# Patient Record
Sex: Female | Born: 1945
Health system: Southern US, Community
[De-identification: ages and names within clinical notes are randomized; demographics above are authoritative.]

## PROBLEM LIST (undated history)

## (undated) DIAGNOSIS — K219 Gastro-esophageal reflux disease without esophagitis: Secondary | ICD-10-CM

## (undated) DIAGNOSIS — R233 Spontaneous ecchymoses: Secondary | ICD-10-CM

## (undated) DIAGNOSIS — H409 Unspecified glaucoma: Secondary | ICD-10-CM

## (undated) DIAGNOSIS — R918 Other nonspecific abnormal finding of lung field: Secondary | ICD-10-CM

## (undated) DIAGNOSIS — B019 Varicella without complication: Secondary | ICD-10-CM

## (undated) DIAGNOSIS — M2040 Other hammer toe(s) (acquired), unspecified foot: Secondary | ICD-10-CM

## (undated) DIAGNOSIS — R238 Other skin changes: Secondary | ICD-10-CM

## (undated) DIAGNOSIS — R609 Edema, unspecified: Secondary | ICD-10-CM

## (undated) DIAGNOSIS — Q796 Ehlers-Danlos syndrome, unspecified: Secondary | ICD-10-CM

## (undated) HISTORY — PX: COLONOSCOPY: SHX174

## (undated) HISTORY — DX: Edema, unspecified: R60.9

## (undated) HISTORY — DX: Other skin changes: R23.8

## (undated) HISTORY — DX: Unspecified glaucoma: H40.9

## (undated) HISTORY — PX: OTHER SURGICAL HISTORY: SHX169

## (undated) HISTORY — DX: Spontaneous ecchymoses: R23.3

## (undated) HISTORY — PX: HAMMER TOE SURGERY: SHX385

## (undated) HISTORY — DX: Ehlers-Danlos syndrome, unspecified: Q79.60

## (undated) HISTORY — DX: Other hammer toe(s) (acquired), unspecified foot: M20.40

## (undated) HISTORY — PX: BREAST BIOPSY: SHX20

---

## 2005-09-17 ENCOUNTER — Ambulatory Visit: Payer: Self-pay | Admitting: Unknown Physician Specialty

## 2005-12-29 ENCOUNTER — Emergency Department: Payer: Self-pay | Admitting: General Practice

## 2006-09-28 ENCOUNTER — Emergency Department: Payer: Self-pay | Admitting: Emergency Medicine

## 2007-05-17 DIAGNOSIS — N200 Calculus of kidney: Secondary | ICD-10-CM | POA: Insufficient documentation

## 2007-05-17 DIAGNOSIS — Q828 Other specified congenital malformations of skin: Secondary | ICD-10-CM | POA: Insufficient documentation

## 2007-05-19 ENCOUNTER — Ambulatory Visit: Payer: Self-pay | Admitting: Unknown Physician Specialty

## 2008-10-25 ENCOUNTER — Ambulatory Visit: Payer: Self-pay | Admitting: Unknown Physician Specialty

## 2008-11-28 ENCOUNTER — Ambulatory Visit: Payer: Self-pay | Admitting: Unknown Physician Specialty

## 2009-12-07 ENCOUNTER — Other Ambulatory Visit: Payer: Self-pay | Admitting: Family Medicine

## 2009-12-07 DIAGNOSIS — R0902 Hypoxemia: Secondary | ICD-10-CM | POA: Insufficient documentation

## 2009-12-07 LAB — HEPATIC FUNCTION PANEL
ALT: 28 U/L (ref 7–35)
AST: 24 U/L (ref 13–35)
Alkaline Phosphatase: 81 U/L (ref 25–125)
Bilirubin, Total: 0.4 mg/dL

## 2009-12-07 LAB — BASIC METABOLIC PANEL
BUN: 12 mg/dL (ref 4–21)
Creatinine: 0.8 mg/dL (ref 0.5–1.1)
Glucose: 90 mg/dL
Potassium: 3.9 mmol/L (ref 3.4–5.3)
Sodium: 138 mmol/L (ref 137–147)

## 2009-12-07 LAB — CBC AND DIFFERENTIAL
HCT: 43 % (ref 36–46)
Hemoglobin: 14.7 g/dL (ref 12.0–16.0)
Neutrophils Absolute: 4 /uL
Platelets: 333 10*3/uL (ref 150–399)
WBC: 6.6 10^3/mL

## 2009-12-14 ENCOUNTER — Ambulatory Visit: Payer: Self-pay | Admitting: General Surgery

## 2010-03-20 ENCOUNTER — Ambulatory Visit: Payer: Self-pay | Admitting: Internal Medicine

## 2010-06-24 ENCOUNTER — Ambulatory Visit: Payer: Self-pay | Admitting: Specialist

## 2010-11-07 ENCOUNTER — Ambulatory Visit: Payer: Self-pay | Admitting: Unknown Physician Specialty

## 2010-11-13 ENCOUNTER — Ambulatory Visit: Payer: Self-pay | Admitting: Unknown Physician Specialty

## 2010-12-24 ENCOUNTER — Ambulatory Visit: Payer: Self-pay | Admitting: Specialist

## 2011-06-19 ENCOUNTER — Ambulatory Visit: Payer: Self-pay | Admitting: Specialist

## 2011-12-02 ENCOUNTER — Ambulatory Visit: Payer: Self-pay | Admitting: Unknown Physician Specialty

## 2012-03-29 ENCOUNTER — Ambulatory Visit: Payer: Self-pay | Admitting: Specialist

## 2012-03-29 LAB — CREATININE, SERUM
Creatinine: 0.7 mg/dL (ref 0.60–1.30)
EGFR (African American): >60
EGFR (Non-African Amer.): >60

## 2012-09-01 ENCOUNTER — Ambulatory Visit: Payer: Self-pay | Admitting: Specialist

## 2012-12-29 HISTORY — PX: FOOT SURGERY: SHX648

## 2013-03-07 ENCOUNTER — Ambulatory Visit: Payer: Self-pay | Admitting: Specialist

## 2013-04-19 ENCOUNTER — Ambulatory Visit: Payer: Self-pay | Admitting: Family Medicine

## 2013-12-28 ENCOUNTER — Encounter: Payer: Self-pay | Admitting: Podiatry

## 2014-01-02 ENCOUNTER — Ambulatory Visit (INDEPENDENT_AMBULATORY_CARE_PROVIDER_SITE_OTHER): Payer: BC Managed Care – PPO | Admitting: Podiatry

## 2014-01-02 ENCOUNTER — Encounter: Payer: Self-pay | Admitting: Podiatry

## 2014-01-02 VITALS — BP 127/92 | HR 93 | Resp 16 | Ht 62.0 in | Wt 196.0 lb

## 2014-01-02 DIAGNOSIS — M2040 Other hammer toe(s) (acquired), unspecified foot: Secondary | ICD-10-CM

## 2014-01-02 NOTE — Progress Notes (Signed)
   Subjective:    Patient ID: Victoria Li, female    DOB: Oct 21, 1946, 68 y.o.   MRN: [id number]  HPI Comments: Want to discuss possible surgery options on both feet      Review of Systems     Objective:   Physical Exam: I have reviewed her past history medications allergies surgeries and social history. Review systems is remarkable for Ehlers-Danlos syndrome. Pulses are palpable bilateral lower extremities neurologic sensorium is intact. Orthopedic evaluation demonstrates hallux abductovalgus deformity and hammertoe deformities bilateral. Reactive hyperkeratosis PIPJ is fourth digits bilateral. These toes are extremely painful on palpation and range of motion. There is no signs of infection in that there is no erythema edema cellulitis drainage or odor.        Assessment & Plan:  Assessment: Hammertoe deformities painful reactive hyperkeratosis fourth digits bilateral.  Plan: We discussed the etiology pathology conservative versus surgical therapies. She is requesting partial amputation of the toes 4 bilateral. I reviewed her past mental history medications and allergies thoroughly with her today she understands that there is a risk with Ehlers-Danlos that she may not heal well and this may result in further surgical complications. We considered her today for partial amputation fourth digits bilateral. I answered all the questions regarding these procedures the best of my ability in layman's terms. She understood it was amenable to it and signed Dr. pages of the consent form. She received 2 Darco shoes prior to leaving for surgical postop.

## 2014-01-16 DIAGNOSIS — M2040 Other hammer toe(s) (acquired), unspecified foot: Secondary | ICD-10-CM

## 2014-01-27 ENCOUNTER — Encounter: Payer: Self-pay | Admitting: Podiatry

## 2014-01-27 DIAGNOSIS — M86679 Other chronic osteomyelitis, unspecified ankle and foot: Secondary | ICD-10-CM

## 2014-01-27 DIAGNOSIS — M2040 Other hammer toe(s) (acquired), unspecified foot: Secondary | ICD-10-CM

## 2014-01-30 ENCOUNTER — Encounter: Payer: Self-pay | Admitting: Podiatry

## 2014-01-30 ENCOUNTER — Telehealth: Payer: Self-pay | Admitting: *Deleted

## 2014-01-30 NOTE — Telephone Encounter (Signed)
Called and spoke with pt said she is doing fine from surgery. Says her toes are bruised but does have feeling in her toes. Told her to elevate above heart, stay off of foot apply ice and take all rx as directed. Also confirmed pts appt. Pt did understand.

## 2014-01-30 NOTE — Progress Notes (Signed)
1. Partial amputation 4th toe both feet   Rx: Percocet 10/325 #40 0 refills - one to two by mouth every 6 - 8 hrs as needed for pain Phenergan 25 mg #30 0 refills one by mouth every 6 - 8 hrs as needed for nausea Keflex 500 mg #30 0 refills one by mouth three times daily

## 2014-01-30 NOTE — Progress Notes (Signed)
Entered in error

## 2014-02-02 ENCOUNTER — Ambulatory Visit (INDEPENDENT_AMBULATORY_CARE_PROVIDER_SITE_OTHER): Payer: BC Managed Care – PPO

## 2014-02-02 ENCOUNTER — Ambulatory Visit (INDEPENDENT_AMBULATORY_CARE_PROVIDER_SITE_OTHER): Payer: BC Managed Care – PPO | Admitting: Podiatry

## 2014-02-02 ENCOUNTER — Encounter: Payer: Self-pay | Admitting: Podiatry

## 2014-02-02 VITALS — BP 117/81 | HR 116 | Temp 98.3°F | Resp 20

## 2014-02-02 DIAGNOSIS — Z9889 Other specified postprocedural states: Secondary | ICD-10-CM

## 2014-02-02 NOTE — Progress Notes (Signed)
She presents today one week status post amputation fourth digits bilateral and hammertoe repair with screw second digit left foot she denies fever chills nausea vomits weeks pain states she's doing quite well.  Objective: Vital signs are stable she is alert and oriented x3. Dry sterile dressing was intact. Once removed demonstrates minimal edema no erythema cellulitis drainage or odor. Sutures are intact and I see no signs of infection.  Assessment: Well-healing surgical foot status post amputation fourth digits bilateral and hammertoe repair second left. X1 week.  Plan: Redressed today dressed a compressive dressing followup with her in one week sutures will NOT be removed.

## 2014-02-09 ENCOUNTER — Ambulatory Visit: Payer: BC Managed Care – PPO | Admitting: Podiatry

## 2014-02-09 VITALS — BP 121/82 | HR 103 | Temp 97.9°F | Resp 16 | Ht 62.0 in | Wt 190.0 lb

## 2014-02-09 DIAGNOSIS — Z9889 Other specified postprocedural states: Secondary | ICD-10-CM

## 2014-02-09 DIAGNOSIS — M79609 Pain in unspecified limb: Secondary | ICD-10-CM

## 2014-02-09 NOTE — Progress Notes (Signed)
She presents today 2 weeks status post amputation fourth digits bilateral hammertoe repair with screw second digit left foot. She states that the second digit of the left foot is the only thing is giving her any trouble. Sutures appear to be intact margins appear to be well coapted with the exception of the second digit of the left foot which is moderately edematous it does not appear to be any cellulitis or drainage from it.  Assessment: Well-healing surgical foot bilateral.  Plan: Redressed today with a dry sterile compressive dressing followup with her in one week an attempt to take out the sutures at that time.

## 2014-02-15 ENCOUNTER — Ambulatory Visit (INDEPENDENT_AMBULATORY_CARE_PROVIDER_SITE_OTHER): Payer: BC Managed Care – PPO | Admitting: Podiatry

## 2014-02-15 ENCOUNTER — Encounter: Payer: Self-pay | Admitting: Podiatry

## 2014-02-15 VITALS — BP 117/82 | HR 92 | Temp 98.1°F | Resp 16

## 2014-02-15 DIAGNOSIS — Z9889 Other specified postprocedural states: Secondary | ICD-10-CM

## 2014-02-15 NOTE — Progress Notes (Signed)
Post op dos 1.30.15 , sutures removed both feet, pt states they are doing well. She denies fever chills nausea vomiting muscle aches and pains.  Objective: Vital signs are stable she is alert and oriented x3. She is status post amputation fourth digits bilateral hammertoe repair second digit left. Sutures are intact margins are well coapted we removed the sutures today.  Assessment: Well-healing surgical foot bilateral.  Plan: Remove the remainder of the sutures today suggested she continue use of the Darco shoe for the next week or so and dressed the second toe on a daily basis with compression wrap. I will followup with her in 2 weeks

## 2014-02-20 ENCOUNTER — Ambulatory Visit (INDEPENDENT_AMBULATORY_CARE_PROVIDER_SITE_OTHER): Payer: BC Managed Care – PPO | Admitting: Podiatry

## 2014-02-20 VITALS — BP 128/89 | HR 96 | Resp 16 | Ht 62.0 in | Wt 189.0 lb

## 2014-02-20 DIAGNOSIS — Z9889 Other specified postprocedural states: Secondary | ICD-10-CM

## 2014-02-20 NOTE — Progress Notes (Signed)
She presents today 24 days after amputation of her fourth digits bilateral and hammertoe repair to the second digit of the left foot with screw fixation. She's going to heal quite well stitches were removed last week. However she is concerned that her second toe nail longer than the rest is rubbing the end of her shoes. Currently she would like a half the end of the second toe the left foot amputated.  Objective: Vital signs are stable she is alert and oriented x3. She's going on to heal quite nicely. The second toe does sit out a little longer than her hallux and the rest of the toes. I suggested she by longer shoes she states that her shoes would not fit appropriately if they were longer. She was simply like to have the second toe removed.  Assessment: Well-healing surgical foot bilateral. Painful second digit of her left foot status post hammertoe repair with shoe gear.  Plan: Discussed etiology pathology conservative versus surgical therapies. I explained to her that in order to perform a disarticulation of the second toe at the DIPJ we would need to allow the screw to remain in the toe for a period of at least 2 months from the date of surgery which would allow a complete arthrodesis at the PIPJ. We could then disarticulate the DIPJ after we removed the screw.

## 2014-03-02 ENCOUNTER — Encounter: Payer: BC Managed Care – PPO | Admitting: Podiatry

## 2014-03-20 ENCOUNTER — Ambulatory Visit (INDEPENDENT_AMBULATORY_CARE_PROVIDER_SITE_OTHER): Payer: BC Managed Care – PPO

## 2014-03-20 ENCOUNTER — Ambulatory Visit (INDEPENDENT_AMBULATORY_CARE_PROVIDER_SITE_OTHER): Payer: BC Managed Care – PPO | Admitting: Podiatry

## 2014-03-20 VITALS — BP 131/79 | HR 86 | Resp 16 | Ht 62.0 in | Wt 180.0 lb

## 2014-03-20 DIAGNOSIS — Z9889 Other specified postprocedural states: Secondary | ICD-10-CM

## 2014-03-20 NOTE — Progress Notes (Signed)
She presents today for followup of her hammertoe repair second digit of her left foot.  Objective: Vital signs are stable she is alert and oriented x3. We discussed in great detail the possible amputation of the distal DIPJ of the second digit left foot once the PIPJ arthrodesis has taken place. I will this to heal completely before attempting the DIPJ disarticulation. I said discussed with her possibility of disarticulating at the PIPJ level the third digit of the left foot as well. Radiographs today demonstrate that she has not completely healed the PIPJ.  Assessment: Slowly healing hammertoe repair second left. Possible surgical disarticulation of the DIPJ #2 and PIPJ #3 of the left foot in the near future.

## 2014-04-10 ENCOUNTER — Encounter: Payer: Self-pay | Admitting: Podiatry

## 2014-04-10 ENCOUNTER — Ambulatory Visit: Payer: Self-pay

## 2014-04-10 ENCOUNTER — Ambulatory Visit (INDEPENDENT_AMBULATORY_CARE_PROVIDER_SITE_OTHER): Payer: BC Managed Care – PPO | Admitting: Podiatry

## 2014-04-10 VITALS — BP 120/82 | HR 100 | Resp 16

## 2014-04-10 DIAGNOSIS — Z9889 Other specified postprocedural states: Secondary | ICD-10-CM

## 2014-04-10 DIAGNOSIS — M2040 Other hammer toe(s) (acquired), unspecified foot: Secondary | ICD-10-CM

## 2014-04-10 NOTE — Progress Notes (Signed)
She presents today for a surgical consult regarding removal of a painful internal fixation second digit of the left foot and consideration of amputation third digit left. Should also consider amputation of third digit right foot.  Objective: Vital signs are stable she is alert and oriented x3. She's complaining of a painful second digit of the left foot which we used straight she states it rubs in her toes she is rather does have the toe taken off. Is becoming more painful than it was prior to surgery. Like to have this been amputated as well as she points to the third digit of the left foot as well as the third digit of the right foot.  Assessment: Hammertoe deformities and painful internal fixation bilateral foot.  Plan: Discussed etiology pathology conservative versus surgical therapies at this point I would consider going ahead and scheduling her for a amputation of the third digit bilateral and removal of internal fixation with amputation of the second digit. I consented her for these procedures today she understands that is amenable to it and I will followup with her in the near future for surgery.

## 2014-04-21 ENCOUNTER — Encounter: Payer: Self-pay | Admitting: Podiatry

## 2014-04-21 DIAGNOSIS — M2040 Other hammer toe(s) (acquired), unspecified foot: Secondary | ICD-10-CM

## 2014-04-21 DIAGNOSIS — Z472 Encounter for removal of internal fixation device: Secondary | ICD-10-CM

## 2014-04-25 ENCOUNTER — Telehealth: Payer: Self-pay | Admitting: *Deleted

## 2014-04-25 NOTE — Progress Notes (Signed)
1. Removal fixation 2nd toe left 2. Partial amputation toes # 2, #3 left and #3 rt   Rx: Percocet 10/325 #40 0 refills one to two by mouth every 6 -8 hrs as needed for pain Phenergan 25 mg #30 0 refills one by mouth every 8 hrs for nausea Keflex 500 mg #20  One by mouth three times daily

## 2014-04-25 NOTE — Telephone Encounter (Signed)
Called and spoke with pt regarding surgery on 4.24.15. Pt states she is doing good, staying off of foot, elevating and taking all rx as directed from dr Milinda Pointer. Verified pts appt, pt understood.

## 2014-04-27 ENCOUNTER — Encounter: Payer: BC Managed Care – PPO | Admitting: Podiatry

## 2014-04-28 ENCOUNTER — Encounter: Payer: Self-pay | Admitting: Podiatry

## 2014-04-28 ENCOUNTER — Ambulatory Visit (INDEPENDENT_AMBULATORY_CARE_PROVIDER_SITE_OTHER): Payer: BC Managed Care – PPO

## 2014-04-28 ENCOUNTER — Ambulatory Visit (INDEPENDENT_AMBULATORY_CARE_PROVIDER_SITE_OTHER): Payer: BC Managed Care – PPO | Admitting: Podiatry

## 2014-04-28 VITALS — BP 121/78 | HR 86 | Resp 16

## 2014-04-28 DIAGNOSIS — Z9889 Other specified postprocedural states: Secondary | ICD-10-CM

## 2014-04-28 DIAGNOSIS — M2040 Other hammer toe(s) (acquired), unspecified foot: Secondary | ICD-10-CM

## 2014-04-30 NOTE — Progress Notes (Signed)
Subjective:     Patient ID: Victoria Li, female   DOB: 1946/02/13, 68 y.o.   MRN: [id number]  HPI patient states she is doing well with her toes and walking with a good gait pattern with no increased swelling or pain currently. One week after digital surgery and amputation both feet   Review of Systems     Objective:   Physical Exam Neurovascular status intact with patient's health history good and well oriented with negative Homans sign noted bilateral. Incision sites themselves are healing well with wound edges well coapted and structural position of the toes at this time in good alignment with patient been very happy    Assessment:     Doing well post  amputation of lesser digits of both feet    Plan:     Reviewed condition and advised patient on continued bandage usage and immobilization. Reapplied sterile dressings to both feet and reappoint 2 weeks for Dr. Milinda Pointer to take stitches out. Earlier if any issues should occur

## 2014-05-01 ENCOUNTER — Encounter: Payer: BC Managed Care – PPO | Admitting: Podiatry

## 2014-05-08 ENCOUNTER — Encounter: Payer: Self-pay | Admitting: Podiatry

## 2014-05-08 ENCOUNTER — Ambulatory Visit (INDEPENDENT_AMBULATORY_CARE_PROVIDER_SITE_OTHER): Payer: BC Managed Care – PPO | Admitting: Podiatry

## 2014-05-08 VITALS — BP 118/79 | HR 93 | Temp 96.7°F | Resp 16

## 2014-05-08 DIAGNOSIS — Z9889 Other specified postprocedural states: Secondary | ICD-10-CM

## 2014-05-08 NOTE — Progress Notes (Signed)
She presents today date of surgery April 24 for followup visit of amputations toes bilateral. She denies fever chills nausea vomiting muscle aches and pains. Sutures appear to be intact she states.  Objective: Vital signs are stable she is alert oriented x3. There is no erythema edema saline is drainage or odor. Margins are intact well coapted.  Assessment: Amputation bilateral toes.  Plan: Sutures removed today and will allow her to start soaking this and Epsom salts warm water of followup with her in a couple of weeks.

## 2014-05-18 ENCOUNTER — Telehealth: Payer: Self-pay | Admitting: *Deleted

## 2014-05-18 NOTE — Telephone Encounter (Signed)
EMPLOYER BRENDA HUDSON CALLED WANTING TO KNOW IF RESTRICTIONS OR NON RESTRICTIONS ARE REQUIRED FOR RETURN BACK TO WORK ON 7.1.15. PER [name] NO RESTRICTIONS AND PT WILL PICK UP NEW LETTER STATING NO RESTRICTIONS ON HER NEXT APPT VISIT ON 5.28.15.

## 2014-05-25 ENCOUNTER — Encounter: Payer: Self-pay | Admitting: Podiatry

## 2014-05-25 ENCOUNTER — Ambulatory Visit (INDEPENDENT_AMBULATORY_CARE_PROVIDER_SITE_OTHER): Payer: BC Managed Care – PPO | Admitting: Podiatrist

## 2014-05-25 VITALS — BP 139/84 | HR 80 | Resp 16

## 2014-05-25 DIAGNOSIS — Z9889 Other specified postprocedural states: Secondary | ICD-10-CM

## 2014-05-25 NOTE — Progress Notes (Signed)
    Subjective: Patient presents today1 month status post foot surgery of the both feet-  Partial amputations were performed on left 2nd toe where internal fixation was also removed, and right 3rd toe.  Date of surgery 04/21/14. Patient denies nausea, vomiting, fevers, chills or night sweats.  Denies calf pain or tenderness.  Relates pain in the right 3rd toe where it still rubs on her shoe and also pain in her left 4th toe.  Relates the left 2nd toe left where the amputation was performed recently feels fine.  Objective:  Neurovascular status is intact Excellent appearance of the postoperative digits is noted. No redness, no swelling, no erythema, no drainage, no malodor is noted. Incision lines appear to be well coapted. She does have a slightly swollen distal pulp of the right third and left fourth digits that is irritating her.  Assessment: Status post amputation bilateral toes  Plan:  Recommended use of Coban and wrap to try and train the distal tip of the toe into a less bulbous appearance. She was given instructions on how to use the Coban wrap and given specific instructions not to wrap the toes too tightly. She will be seen back in 2 weeks for recheck per her request that Dr. Milinda Pointer check these digits in case she has to have more surgery on them in the future.

## 2014-05-25 NOTE — Patient Instructions (Signed)
Apply the wrap to the toes as instructed.  He will check you again in 2 weeks

## 2014-05-29 ENCOUNTER — Ambulatory Visit: Payer: Self-pay | Admitting: Family Medicine

## 2014-05-30 ENCOUNTER — Ambulatory Visit: Payer: Self-pay | Admitting: Specialist

## 2014-06-08 ENCOUNTER — Ambulatory Visit (INDEPENDENT_AMBULATORY_CARE_PROVIDER_SITE_OTHER): Payer: BC Managed Care – PPO | Admitting: Podiatry

## 2014-06-08 VITALS — BP 125/79 | HR 97 | Resp 16

## 2014-06-08 DIAGNOSIS — Z9889 Other specified postprocedural states: Secondary | ICD-10-CM

## 2014-06-09 NOTE — Progress Notes (Signed)
She presents today for followup of amputations third digit right foot and third digit left foot. At this point she's doing quite well however the third digit of the right foot is still has some healing to do distally. At this point he does appear to be dorsiflexed at the metatarsophalangeal joint which is bothering her shoe gear.  Objective: Pulses are strongly palpable bilateral superficial ulceration or nonhealing distal aspect of her third toe and fascia site right. Mild dorsiflexion at the metatarsophalangeal joint.  Assessment: Delayed wound healing and fixation sites right with slight dorsiflexion third right.  Plan: Discussed etiology pathology conservative versus surgical therapies. At this point I did discuss an in office tenotomy of the third toe. I will followup with her once her skin is completely healed.

## 2014-06-22 ENCOUNTER — Encounter: Payer: Self-pay | Admitting: Podiatry

## 2014-06-22 ENCOUNTER — Ambulatory Visit (INDEPENDENT_AMBULATORY_CARE_PROVIDER_SITE_OTHER): Payer: BC Managed Care – PPO | Admitting: Podiatry

## 2014-06-22 ENCOUNTER — Ambulatory Visit (INDEPENDENT_AMBULATORY_CARE_PROVIDER_SITE_OTHER): Payer: BC Managed Care – PPO

## 2014-06-22 DIAGNOSIS — S98139A Complete traumatic amputation of one unspecified lesser toe, initial encounter: Secondary | ICD-10-CM

## 2014-06-22 DIAGNOSIS — Z9889 Other specified postprocedural states: Secondary | ICD-10-CM

## 2014-06-22 NOTE — Progress Notes (Signed)
She presents today for her final followup visit regarding amputation selected digits bilateral foot. She denies any fever chills nausea vomiting muscle aches or pains or any problems with healing wounds.  Objective: All wounds appear to be healing quite nicely pulses remain palpable radiographic evaluation confirms amputation sites and disarticulations to bilateral foot.  Assessment: Well-healing surgical foot bilateral.  Plan: Followup with me as needed.

## 2014-07-17 ENCOUNTER — Ambulatory Visit: Payer: BC Managed Care – PPO | Admitting: Podiatry

## 2015-02-27 ENCOUNTER — Encounter: Payer: Self-pay | Admitting: *Deleted

## 2015-03-07 ENCOUNTER — Encounter: Payer: Self-pay | Admitting: General Surgery

## 2015-03-08 ENCOUNTER — Encounter: Payer: Self-pay | Admitting: General Surgery

## 2015-03-08 ENCOUNTER — Ambulatory Visit (INDEPENDENT_AMBULATORY_CARE_PROVIDER_SITE_OTHER): Payer: PPO | Admitting: General Surgery

## 2015-03-08 VITALS — BP 134/74 | HR 74 | Resp 12 | Ht 62.0 in | Wt 224.0 lb

## 2015-03-08 DIAGNOSIS — Z800 Family history of malignant neoplasm of digestive organs: Secondary | ICD-10-CM

## 2015-03-08 DIAGNOSIS — Z1211 Encounter for screening for malignant neoplasm of colon: Secondary | ICD-10-CM

## 2015-03-08 MED ORDER — POLYETHYLENE GLYCOL 3350 17 GM/SCOOP PO POWD: ORAL | Status: DC

## 2015-03-08 NOTE — Progress Notes (Signed)
Patient ID: Victoria Li, female   DOB: [birth year], 69 y.o.   MRN: [id number]  Chief Complaint  Patient presents with  . Colonoscopy    HPI Victoria Li is a 69 y.o. female here today for a evaluation of a colonoscopy. Patient states she is doing well with no GI problems. Patient has a family history of colon cancer. Her last colonoscopy was December 17, 2009.  The patient's mother died at age 51 from colon cancer. HPI  Past Medical History  Diagnosis Date  . Glaucoma   . Swelling   . Bruises easily   . Ehlers-Danlos syndrome     Past Surgical History  Procedure Laterality Date  . Colonoscopy    . Foot surgery Bilateral 2014    toes removed    Family History  Problem Relation Age of Onset  . Colon cancer Mother   . Colon cancer Maternal Aunt   . Diverticulitis Cousin     Social History History  Substance Use Topics  . Smoking status: Never Smoker   . Smokeless tobacco: Never Used  . Alcohol Use: No    No Known Allergies  Current Outpatient Prescriptions  Medication Sig Dispense Refill  . Cholecalciferol (VITAMIN D3) 1000 UNITS CHEW Chew by mouth daily.    Marland Kitchen latanoprost (XALATAN) 0.005 % ophthalmic solution Place 1 drop into both eyes at bedtime.    . polyethylene glycol powder (GLYCOLAX/MIRALAX) powder 255 grams one bottle for colonoscopy prep 255 g 0   No current facility-administered medications for this visit.    Review of Systems Review of Systems  Constitutional: Negative.   Respiratory: Negative.   Cardiovascular: Negative.     Blood pressure 134/74, pulse 74, resp. rate 12, height 5\' 2"  (1.575 m), weight 224 lb (101.606 kg).  Physical Exam Physical Exam  Constitutional: She appears well-developed and well-nourished.  Eyes: Conjunctivae are normal.  Neck: Neck supple.  Cardiovascular: Normal rate, regular rhythm and normal heart sounds.   Pulmonary/Chest: Effort normal and breath sounds normal.  Abdominal: Soft. Bowel sounds are normal. There is no  tenderness.  Lymphadenopathy:    She has no cervical adenopathy.  Neurological: She is alert.  Skin: Skin is warm and dry.    Data Reviewed Colonoscopy completed 2009/12/17 was notable for multiple small polyps within the rectum. Pathology showed hyperplastic polyps.    Assessment    family history of colon cancer. Personal history of hyperplastic polyps.    Plan    Discuss colonoscopy, including risk and benefits. Possibility of bleeding and perforation reviewed. Patient amenable to proceed.   Patient has been scheduled for a colonoscopy on 04-18-15 at [facility]     PCP:  Theda Sers 03/09/2015, 7:06 AM

## 2015-03-08 NOTE — Patient Instructions (Addendum)
Colonoscopy A colonoscopy is an exam to look at the entire large intestine (colon). This exam can help find problems such as tumors, polyps, inflammation, and areas of bleeding. The exam takes about 1 hour.  LET [facility] CARE PROVIDER KNOW ABOUT:   Any allergies you have.  All medicines you are taking, including vitamins, herbs, eye drops, creams, and over-the-counter medicines.  Previous problems you or members of your family have had with the use of anesthetics.  Any blood disorders you have.  Previous surgeries you have had.  Medical conditions you have. RISKS AND COMPLICATIONS  Generally, this is a safe procedure. However, as with any procedure, complications can occur. Possible complications include:  Bleeding.  Tearing or rupture of the colon Milson.  Reaction to medicines given during the exam.  Infection (rare). BEFORE THE PROCEDURE   Ask your health care provider about changing or stopping your regular medicines.  You may be prescribed an oral bowel prep. This involves drinking a large amount of medicated liquid, starting the day before your procedure. The liquid will cause you to have multiple loose stools until your stool is almost clear or light green. This cleans out your colon in preparation for the procedure.  Do not eat or drink anything else once you have started the bowel prep, unless your health care provider tells you it is safe to do so.  Arrange for someone to drive you home after the procedure. PROCEDURE   You will be given medicine to help you relax (sedative).  You will lie on your side with your knees bent.  A long, flexible tube with a light and camera on the end (colonoscope) will be inserted through the rectum and into the colon. The camera sends video back to a computer screen as it moves through the colon. The colonoscope also releases carbon dioxide gas to inflate the colon. This helps your health care provider see the area better.  During  the exam, your health care provider may take a small tissue sample (biopsy) to be examined under a microscope if any abnormalities are found.  The exam is finished when the entire colon has been viewed. AFTER THE PROCEDURE   Do not drive for 24 hours after the exam.  You may have a small amount of blood in your stool.  You may pass moderate amounts of gas and have mild abdominal cramping or bloating. This is caused by the gas used to inflate your colon during the exam.  Ask when your test results will be ready and how you will get your results. Make sure you get your test results. Document Released: 12/12/2000 Document Revised: 10/05/2013 Document Reviewed: 08/22/2013 [facility] Patient Information 2015 [place], [place]. This information is not intended to replace advice given to you by your health care provider. Make sure you discuss any questions you have with your health care provider.  Patient has been scheduled for a colonoscopy on 04-18-15 at [facility].

## 2015-03-09 ENCOUNTER — Other Ambulatory Visit: Payer: Self-pay | Admitting: General Surgery

## 2015-03-09 DIAGNOSIS — Z800 Family history of malignant neoplasm of digestive organs: Secondary | ICD-10-CM | POA: Insufficient documentation

## 2015-04-11 ENCOUNTER — Telehealth: Payer: Self-pay | Admitting: *Deleted

## 2015-04-11 NOTE — Telephone Encounter (Signed)
Patient confirms no medication changes since last office visit. She states she has Miralax prescription but has yet to pre-register.   We will proceed with colonoscopy that is scheduled at [facility] for 04-18-15.   This patient was instructed to call the office if she has further questions.

## 2015-04-18 ENCOUNTER — Ambulatory Visit
Admit: 2015-04-18 | Disposition: A | Discharge status: Home / Self Care | Payer: Self-pay | Attending: General Surgery | Admitting: General Surgery

## 2015-04-18 DIAGNOSIS — Z1211 Encounter for screening for malignant neoplasm of colon: Secondary | ICD-10-CM | POA: Diagnosis not present

## 2015-04-19 ENCOUNTER — Encounter: Payer: Self-pay | Admitting: General Surgery

## 2015-04-20 ENCOUNTER — Encounter: Payer: Self-pay | Admitting: General Surgery

## 2015-04-20 ENCOUNTER — Telehealth: Payer: Self-pay

## 2015-04-20 NOTE — Telephone Encounter (Signed)
Notified patient as instructed, patient pleased. Discussed follow-up in 5 years, patient agrees. Patient placed in recalls.   

## 2015-04-20 NOTE — Telephone Encounter (Signed)
-----   Message from Robert Bellow, MD sent at 04/20/2015  9:15 AM EDT ----- Please notify the patient that the polyps removed at the time of the colonoscopy completed on April 20 were entirely benign. We'll arrange for repeat exam in 5 years because of her mother's history of colon cancer. Thank you ----- Message -----    From: Augustin Schooling, CMA    Sent: 04/20/2015   8:41 AM      To: Robert Bellow, MD

## 2015-04-23 ENCOUNTER — Ambulatory Visit: Payer: PPO | Admitting: General Surgery

## 2015-04-23 LAB — SURGICAL PATHOLOGY

## 2015-05-03 ENCOUNTER — Other Ambulatory Visit: Payer: Self-pay

## 2015-05-03 DIAGNOSIS — Z1231 Encounter for screening mammogram for malignant neoplasm of breast: Secondary | ICD-10-CM

## 2015-05-30 ENCOUNTER — Encounter: Payer: Self-pay | Admitting: *Deleted

## 2015-05-31 ENCOUNTER — Ambulatory Visit
Admission: RE | Admit: 2015-05-31 | Discharge: 2015-05-31 | Disposition: A | Discharge status: Home / Self Care | Payer: PPO | Source: Ambulatory Visit | Attending: Nurse Practitioner | Admitting: Nurse Practitioner

## 2015-05-31 ENCOUNTER — Other Ambulatory Visit: Payer: Self-pay | Admitting: Nurse Practitioner

## 2015-05-31 DIAGNOSIS — Z1231 Encounter for screening mammogram for malignant neoplasm of breast: Secondary | ICD-10-CM | POA: Diagnosis present

## 2015-05-31 DIAGNOSIS — R922 Inconclusive mammogram: Secondary | ICD-10-CM | POA: Insufficient documentation

## 2015-06-01 ENCOUNTER — Other Ambulatory Visit: Payer: Self-pay | Admitting: Unknown Physician Specialty

## 2015-06-01 DIAGNOSIS — N63 Unspecified lump in unspecified breast: Secondary | ICD-10-CM

## 2015-06-01 DIAGNOSIS — R928 Other abnormal and inconclusive findings on diagnostic imaging of breast: Secondary | ICD-10-CM

## 2015-06-06 ENCOUNTER — Ambulatory Visit
Admission: RE | Admit: 2015-06-06 | Discharge: 2015-06-06 | Disposition: A | Discharge status: Home / Self Care | Payer: PPO | Source: Ambulatory Visit | Attending: Unknown Physician Specialty | Admitting: Unknown Physician Specialty

## 2015-06-06 ENCOUNTER — Ambulatory Visit: Payer: PPO

## 2015-06-06 DIAGNOSIS — N63 Unspecified lump in unspecified breast: Secondary | ICD-10-CM

## 2015-06-06 DIAGNOSIS — R928 Other abnormal and inconclusive findings on diagnostic imaging of breast: Secondary | ICD-10-CM | POA: Diagnosis not present

## 2015-06-06 DIAGNOSIS — R921 Mammographic calcification found on diagnostic imaging of breast: Secondary | ICD-10-CM | POA: Insufficient documentation

## 2015-10-01 ENCOUNTER — Other Ambulatory Visit: Payer: Self-pay | Admitting: Unknown Physician Specialty

## 2015-10-01 DIAGNOSIS — Z13820 Encounter for screening for osteoporosis: Secondary | ICD-10-CM

## 2015-10-18 ENCOUNTER — Ambulatory Visit: Payer: PPO | Attending: Unknown Physician Specialty

## 2016-01-21 ENCOUNTER — Ambulatory Visit: Payer: PPO | Attending: Unknown Physician Specialty

## 2016-01-28 DIAGNOSIS — H40013 Open angle with borderline findings, low risk, bilateral: Secondary | ICD-10-CM | POA: Diagnosis not present

## 2016-01-29 ENCOUNTER — Ambulatory Visit
Admission: RE | Admit: 2016-01-29 | Discharge: 2016-01-29 | Disposition: A | Discharge status: Home / Self Care | Payer: PPO | Source: Ambulatory Visit | Attending: Unknown Physician Specialty | Admitting: Unknown Physician Specialty

## 2016-01-29 DIAGNOSIS — Z780 Asymptomatic menopausal state: Secondary | ICD-10-CM | POA: Insufficient documentation

## 2016-01-29 DIAGNOSIS — Z13820 Encounter for screening for osteoporosis: Secondary | ICD-10-CM | POA: Diagnosis not present

## 2016-02-20 DIAGNOSIS — H40013 Open angle with borderline findings, low risk, bilateral: Secondary | ICD-10-CM | POA: Diagnosis not present

## 2016-03-03 ENCOUNTER — Telehealth: Payer: Self-pay

## 2016-03-03 NOTE — Telephone Encounter (Signed)
This patient has not seen Dr. Rosanna Randy since 2008. Looks like its dr. Venia Minks or dr. Caryn Section maybe, this patient is on our schedule for tomorrow. Please look into this and move if need to -aa

## 2016-03-03 NOTE — Telephone Encounter (Signed)
Ok for pt ro be seen per Dr. Rosanna Randy.

## 2016-03-04 ENCOUNTER — Ambulatory Visit
Admission: RE | Admit: 2016-03-04 | Discharge: 2016-03-04 | Disposition: A | Discharge status: Home / Self Care | Payer: PPO | Source: Ambulatory Visit | Attending: Family Medicine | Admitting: Family Medicine

## 2016-03-04 ENCOUNTER — Ambulatory Visit (INDEPENDENT_AMBULATORY_CARE_PROVIDER_SITE_OTHER): Payer: PPO | Admitting: Family Medicine

## 2016-03-04 VITALS — BP 100/64 | HR 88 | Temp 98.6°F | Resp 16 | Wt 215.0 lb

## 2016-03-04 DIAGNOSIS — M542 Cervicalgia: Secondary | ICD-10-CM

## 2016-03-04 DIAGNOSIS — M50322 Other cervical disc degeneration at C5-C6 level: Secondary | ICD-10-CM | POA: Insufficient documentation

## 2016-03-04 DIAGNOSIS — M50323 Other cervical disc degeneration at C6-C7 level: Secondary | ICD-10-CM | POA: Diagnosis not present

## 2016-03-04 MED ORDER — TIZANIDINE HCL 4 MG PO TABS: 4.0000 mg | ORAL_TABLET | Freq: Four times a day (QID) | ORAL | Status: DC | PRN

## 2016-03-04 MED ORDER — NAPROXEN 375 MG PO TABS: 375.0000 mg | ORAL_TABLET | Freq: Two times a day (BID) | ORAL | Status: DC

## 2016-03-04 NOTE — Progress Notes (Signed)
Patient ID: Victoria Li, female   DOB: 02-05-1946, 70 y.o.   MRN: [id number]   Victoria Li  MRN: [id number] DOB: 1946-09-27  Subjective:  HPI   1. Neck pain The patient is a 70 year old female who presents for evaluation of neck pain.  Right side more pain than the left.She states she has had neck pain for about 2 months with no relief.  She has been using ice, Ibuprofen and Biofreeze.  She states she went to the chiropractor but he would not do an adjustment and she thinks it is due to her Ehlers-Danlos Syndrome.  He did however do Accu-puncture, TENS and ultrasound.  She states that she got minimal relief while having the procedures but then it would go right back to the same level of pain as it was prior to treatment.  Patient would like to have x-ray of her neck to see what is causing the pain.  She does have history of scoliosis of the lower back and arthritis of some of her disks that were found as an incidental finding when having x-rays for a kidney stone.  Patient Active Problem List   Diagnosis Date Noted  . Family history of colon cancer 03/09/2015    Past Medical History  Diagnosis Date  . Glaucoma   . Swelling   . Bruises easily   . Ehlers-Danlos syndrome     Social History   Social History  . Marital Status: Married    Spouse Name: N/A  . Number of Children: N/A  . Years of Education: N/A   Occupational History  . Not on file.   Social History Main Topics  . Smoking status: Never Smoker   . Smokeless tobacco: Never Used  . Alcohol Use: No  . Drug Use: No  . Sexual Activity: Not on file   Other Topics Concern  . Not on file   Social History Narrative    Outpatient Prescriptions Prior to Visit  Medication Sig Dispense Refill  . Cholecalciferol (VITAMIN D3) 1000 UNITS CHEW Chew by mouth daily.    Marland Kitchen latanoprost (XALATAN) 0.005 % ophthalmic solution Place 1 drop into both eyes at bedtime.    . polyethylene glycol powder (GLYCOLAX/MIRALAX)  powder 255 grams one bottle for colonoscopy prep 255 g 0   No facility-administered medications prior to visit.    No Known Allergies  Review of Systems  Constitutional: Positive for malaise/fatigue. Negative for fever and chills.  Respiratory: Negative for cough, shortness of breath and wheezing.   Cardiovascular: Negative for chest pain, palpitations, orthopnea and leg swelling.  Musculoskeletal: Positive for myalgias and neck pain. Negative for back pain, joint pain and falls.       Pain in the scapula area  Neurological: Positive for headaches. Negative for dizziness, tingling, tremors, sensory change, seizures and weakness.  Psychiatric/Behavioral: Negative.    Objective:  BP 100/64 mmHg  Pulse 88  Temp(Src) 98.6 F (37 C) (Oral)  Resp 16  Wt 215 lb (97.523 kg)  Physical Exam  Constitutional: She is oriented to person, place, and time and well-developed, well-nourished, and in no distress.  HENT:  Head: Normocephalic and atraumatic.  Right Ear: External ear normal.  Left Ear: External ear normal.  Nose: Nose normal.  Eyes: Pupils are equal, round, and reactive to light.  Neck: Normal range of motion. Neck supple.  Cardiovascular: Normal rate, regular rhythm and normal heart sounds.   Pulmonary/Chest: Effort normal and breath sounds normal.  Abdominal: Soft.  Musculoskeletal: She exhibits tenderness (Over C6 and C7).  Neurological: She is alert and oriented to person, place, and time. Gait normal.  Skin: Skin is warm and dry.  Psychiatric: Mood, memory, affect and judgment normal.    Assessment and Plan :  Neck pain  Most likely arthritic/DDD No signs or symptoms of cervical radiculopathy. Ehlers-Danlos syndrome Advised patient that manipulation of cervical spine by chiropractor is probably not a good idea. He has made her to limit his manipulation of her spine by history. I have done the exam and reviewed the above chart and it is accurate to the best of my  knowledge.   Miguel Aschoff MD [facility] 03/04/2016 1:49 PM

## 2016-03-06 ENCOUNTER — Telehealth: Payer: Self-pay

## 2016-03-06 NOTE — Telephone Encounter (Signed)
X-ray of neck shows some degenerative disc and facet joint disease at C5-6 and C6-7. No acute bony abnormalities/fractures. Did she and Dr. Rosanna Randy discuss use of NSAID's or referral to orthopedist?

## 2016-03-06 NOTE — Telephone Encounter (Signed)
Patient called wanting to know xray results. Advised patient that Dr. Rosanna Randy is not in the office. Could you review for patient? Thanks!

## 2016-03-13 NOTE — Telephone Encounter (Signed)
Pt is returning call.  [id number]

## 2016-03-13 NOTE — Telephone Encounter (Signed)
Pt was already advised last week but message was not taking out, she is doing ok with the medications.-aa

## 2016-03-13 NOTE — Telephone Encounter (Signed)
Left message to call back  

## 2016-04-24 ENCOUNTER — Other Ambulatory Visit: Payer: Self-pay | Admitting: Unknown Physician Specialty

## 2016-04-24 DIAGNOSIS — Z1231 Encounter for screening mammogram for malignant neoplasm of breast: Secondary | ICD-10-CM

## 2016-06-02 ENCOUNTER — Other Ambulatory Visit: Payer: Self-pay | Admitting: Unknown Physician Specialty

## 2016-06-02 ENCOUNTER — Ambulatory Visit
Admission: RE | Admit: 2016-06-02 | Discharge: 2016-06-02 | Disposition: A | Discharge status: Home / Self Care | Payer: PPO | Source: Ambulatory Visit | Attending: Unknown Physician Specialty | Admitting: Unknown Physician Specialty

## 2016-06-02 DIAGNOSIS — Z1231 Encounter for screening mammogram for malignant neoplasm of breast: Secondary | ICD-10-CM | POA: Diagnosis not present

## 2016-07-04 DIAGNOSIS — M549 Dorsalgia, unspecified: Secondary | ICD-10-CM | POA: Diagnosis not present

## 2016-07-04 DIAGNOSIS — M25551 Pain in right hip: Secondary | ICD-10-CM | POA: Diagnosis not present

## 2016-07-23 ENCOUNTER — Encounter: Payer: Self-pay | Admitting: Podiatry

## 2016-07-23 ENCOUNTER — Ambulatory Visit (INDEPENDENT_AMBULATORY_CARE_PROVIDER_SITE_OTHER): Payer: PPO | Admitting: Podiatry

## 2016-07-23 ENCOUNTER — Ambulatory Visit (INDEPENDENT_AMBULATORY_CARE_PROVIDER_SITE_OTHER): Payer: PPO

## 2016-07-23 VITALS — BP 114/72 | HR 86 | Resp 12

## 2016-07-23 DIAGNOSIS — M79672 Pain in left foot: Secondary | ICD-10-CM | POA: Diagnosis not present

## 2016-07-23 DIAGNOSIS — M205X2 Other deformities of toe(s) (acquired), left foot: Secondary | ICD-10-CM

## 2016-07-23 DIAGNOSIS — L89891 Pressure ulcer of other site, stage 1: Secondary | ICD-10-CM | POA: Diagnosis not present

## 2016-07-23 DIAGNOSIS — L97521 Non-pressure chronic ulcer of other part of left foot limited to breakdown of skin: Secondary | ICD-10-CM

## 2016-07-23 MED ORDER — MUPIROCIN 2 % EX OINT: TOPICAL_OINTMENT | CUTANEOUS | 2 refills | Status: DC

## 2016-07-23 NOTE — Progress Notes (Signed)
She presents today with chief complaint of a cyst to the dorsal medial aspect of the first metatarsophalangeal joint of the left foot. She states it is becoming more painful and really not quite sure what to do about it.  Objective: Vital signs are stable alert and oriented 3. Pulses are palpable. She does have what appears to be a near nearly ulcerated cyst to the dorsal aspect dorsal medial aspect of the first metatarsophalangeal joints. 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 Shoe gear and irritation more than likely resulted in this skin breakdown.  Assessment: Superficial ulceration after debridement.  Plan: Start her on Bactroban ointment daily and cover with a offloading dressing. Once this is healed and we will consider surgical intervention to excise the spur.

## 2016-08-06 ENCOUNTER — Encounter: Payer: Self-pay | Admitting: Podiatry

## 2016-08-06 ENCOUNTER — Ambulatory Visit (INDEPENDENT_AMBULATORY_CARE_PROVIDER_SITE_OTHER): Payer: PPO | Admitting: Podiatry

## 2016-08-06 DIAGNOSIS — L89891 Pressure ulcer of other site, stage 1: Secondary | ICD-10-CM | POA: Diagnosis not present

## 2016-08-06 DIAGNOSIS — L97521 Non-pressure chronic ulcer of other part of left foot limited to breakdown of skin: Secondary | ICD-10-CM

## 2016-08-06 NOTE — Progress Notes (Signed)
She presents today for follow-up of her ulceration first metatarsophalangeal joint of her left foot. She states this seems to be healed.  Objective: Vital signs stable alert and oriented 3. No erythema cellulitis drainage or odor. Reactive hyperkeratosis was debrided does not demonstrate any type of open wound. I see no signs of infection.  Assessment: Well-healing ulceration dorsal aspect first metatarsophalangeal joint of the left foot secondary to an underlying spur.  Plan: We discussed etiology pathology conservative versus surgical therapy is reviewed radiographs today debridement and reactive hyperkeratosis will follow up with me on an as-needed basis or schedule for surgical intervention.

## 2016-09-05 DIAGNOSIS — L308 Other specified dermatitis: Secondary | ICD-10-CM | POA: Diagnosis not present

## 2016-10-08 DIAGNOSIS — H40013 Open angle with borderline findings, low risk, bilateral: Secondary | ICD-10-CM | POA: Diagnosis not present

## 2016-10-22 ENCOUNTER — Ambulatory Visit (INDEPENDENT_AMBULATORY_CARE_PROVIDER_SITE_OTHER): Payer: PPO | Admitting: Family Medicine

## 2016-10-22 ENCOUNTER — Encounter: Payer: Self-pay | Admitting: Family Medicine

## 2016-10-22 VITALS — BP 122/62 | Temp 97.9°F | Resp 20 | Wt 217.0 lb

## 2016-10-22 DIAGNOSIS — Q796 Ehlers-Danlos syndrome, unspecified: Secondary | ICD-10-CM

## 2016-10-22 DIAGNOSIS — J209 Acute bronchitis, unspecified: Secondary | ICD-10-CM | POA: Diagnosis not present

## 2016-10-22 MED ORDER — ALBUTEROL SULFATE HFA 108 (90 BASE) MCG/ACT IN AERS: 2.0000 | INHALATION_SPRAY | Freq: Four times a day (QID) | RESPIRATORY_TRACT | 0 refills | Status: DC | PRN

## 2016-10-22 MED ORDER — LEVALBUTEROL HCL 1.25 MG/3ML IN NEBU: 1.2500 mg | INHALATION_SOLUTION | RESPIRATORY_TRACT | 12 refills | Status: DC | PRN

## 2016-10-22 NOTE — Progress Notes (Signed)
Patient: Victoria Li Female    DOB: [birth year]   70 y.o.   MRN: [id number] Visit Date: 10/22/2016  Today's Provider: Wilhemena Durie, MD   Chief Complaint  Patient presents with  . URI   Subjective:    HPI Patient comes in today c/o cough and congestion. She reports that 4 days ago she went out of town, and rode on a ferry boat and feels this may have caused her symptoms. Patient reports that she also has history of recurrent bronchitis. Patient reports that she only has had Mucinex OTC which has not helped. She denies having any fever.     No Known Allergies   Current Outpatient Prescriptions:  .  ascorbic Acid (VITAMIN C) 500 MG CPCR, Take 500 mg by mouth daily., Disp: , Rfl:  .  Cholecalciferol (VITAMIN D3) 1000 UNITS CHEW, Chew by mouth daily., Disp: , Rfl:  .  famotidine (PEPCID) 10 MG tablet, Take 10 mg by mouth daily., Disp: , Rfl:  .  folic acid (FOLVITE) 0.5 MG tablet, Take 0.5 mg by mouth daily., Disp: , Rfl:  .  latanoprost (XALATAN) 0.005 % ophthalmic solution, Place 1 drop into both eyes at bedtime., Disp: , Rfl:  .  mupirocin ointment (BACTROBAN) 2 %, Apply to wound twice a day., Disp: 30 g, Rfl: 2 .  naproxen (NAPROSYN) 375 MG tablet, Take 1 tablet (375 mg total) by mouth 2 (two) times daily with a meal., Disp: 60 tablet, Rfl: 3 .  tiZANidine (ZANAFLEX) 4 MG tablet, Take 1 tablet (4 mg total) by mouth every 6 (six) hours as needed for muscle spasms., Disp: 30 tablet, Rfl: 3 .  vitamin B-12 (CYANOCOBALAMIN) 500 MCG tablet, Take 500 mcg by mouth daily., Disp: , Rfl:   Review of Systems  Constitutional: Positive for fatigue.  HENT: Positive for congestion, postnasal drip, sinus pressure and sore throat.   Respiratory: Positive for cough, chest tightness and shortness of breath.   Allergic/Immunologic: Negative.   Psychiatric/Behavioral: Negative.     Social History  Substance Use Topics  . Smoking status: Never Smoker  . Smokeless tobacco:  Never Used  . Alcohol use No   Objective:   BP 122/62 (BP Location: Right Wrist, Patient Position: Sitting, Cuff Size: Normal)   Temp 97.9 F (36.6 C)   Resp 20   Wt 217 lb (98.4 kg)   BMI 39.69 kg/m   Physical Exam  Constitutional: She appears well-developed and well-nourished.  HENT:  Head: Normocephalic and atraumatic.  Right Ear: External ear normal.  Left Ear: External ear normal.  Nose: Nose normal.  Mouth/Throat: Oropharynx is clear and moist.  Eyes: Conjunctivae are normal. No scleral icterus.  Neck: No thyromegaly present.  Cardiovascular: Normal rate, regular rhythm and normal heart sounds.   Pulmonary/Chest: Effort normal and breath sounds normal.  Abdominal: Soft.  Lymphadenopathy:    She has no cervical adenopathy.  Skin: Skin is warm and dry.  Psychiatric: She has a normal mood and affect. Her behavior is normal. Judgment and thought content normal.        Assessment & Plan:     1. Acute bronchitis, unspecified organism Discussed with the patient that I fully believe this is viral. Antibiotics will be of no benefit and may actually cause side effects. We'll treat with albuterol MDI and continue with Mucinex DM. Push fluids. If she worsens and she calls back we'll call in doxycycline.It is of note that her cough did loosen  up some after Xopenex treatment with nebulizer in the office. - levalbuterol (XOPENEX) 1.25 MG/3ML nebulizer solution; Take 1.25 mg by nebulization every 4 (four) hours as needed for wheezing.  Dispense: 72 mL; Refill: 12 2. Ehlers-Danlos syndrome  I have done the exam and reviewed the chart and it is accurate to the best of my knowledge. Miguel Aschoff M.D. [place], MD  [facility]

## 2016-10-22 NOTE — Patient Instructions (Signed)
May start Robitussin and continue Mucinex DM.

## 2016-11-07 ENCOUNTER — Encounter: Payer: Self-pay | Admitting: Physician Assistant

## 2016-11-07 ENCOUNTER — Ambulatory Visit (INDEPENDENT_AMBULATORY_CARE_PROVIDER_SITE_OTHER): Payer: PPO | Admitting: Physician Assistant

## 2016-11-07 VITALS — BP 116/68 | HR 96 | Temp 97.5°F | Resp 16 | Wt 219.0 lb

## 2016-11-07 DIAGNOSIS — R05 Cough: Secondary | ICD-10-CM

## 2016-11-07 DIAGNOSIS — R059 Cough, unspecified: Secondary | ICD-10-CM

## 2016-11-07 MED ORDER — DOXYCYCLINE HYCLATE 100 MG PO TABS: 100.0000 mg | ORAL_TABLET | Freq: Two times a day (BID) | ORAL | 0 refills | Status: DC

## 2016-11-07 MED ORDER — BENZONATATE 100 MG PO CAPS: 100.0000 mg | ORAL_CAPSULE | Freq: Three times a day (TID) | ORAL | 0 refills | Status: AC | PRN

## 2016-11-07 NOTE — Patient Instructions (Signed)

## 2016-11-07 NOTE — Progress Notes (Signed)
Patient: Victoria Li Female    DOB: [birth year]   70 y.o.   MRN: [id number] Visit Date: 11/07/2016  Today's Provider: Trinna Post, PA-C   Chief Complaint  Patient presents with  . URI  . Cough   Subjective:    URI   This is a new (Started about 4 weeks ago. ) problem. The problem has been gradually worsening. There has been no fever. Associated symptoms include congestion, coughing and wheezing. Pertinent negatives include no ear pain, headaches, rhinorrhea, sinus pain or sore throat. The treatment provided no relief.  Cough  This is a new problem. The current episode started 1 to 4 weeks ago. The problem has been gradually worsening. The cough is productive of sputum. Associated symptoms include eye redness, shortness of breath and wheezing. Pertinent negatives include no chills, ear pain, fever, headaches, postnasal drip, rhinorrhea or sore throat. She has tried steroid inhaler for the symptoms. The treatment provided no relief.   Albuterol inhaler 3-4 times/day. Patient tried two nebulizer treatments of xopenex at home without relief.   Patient seen in clinic last week by Dr. Rosanna Randy, who wrote in his note that doxycycline would be appropriate for continuation of symptoms.  No Known Allergies   Current Outpatient Prescriptions:  .  albuterol (PROVENTIL HFA;VENTOLIN HFA) 108 (90 Base) MCG/ACT inhaler, Inhale 2 puffs into the lungs every 6 (six) hours as needed for wheezing or shortness of breath., Disp: 1 Inhaler, Rfl: 0 .  ascorbic Acid (VITAMIN C) 500 MG CPCR, Take 500 mg by mouth daily., Disp: , Rfl:  .  Cholecalciferol (VITAMIN D3) 1000 UNITS CHEW, Chew by mouth daily., Disp: , Rfl:  .  famotidine (PEPCID) 10 MG tablet, Take 10 mg by mouth daily., Disp: , Rfl:  .  folic acid (FOLVITE) 0.5 MG tablet, Take 0.5 mg by mouth daily., Disp: , Rfl:  .  latanoprost (XALATAN) 0.005 % ophthalmic solution, Place 1 drop into both eyes at bedtime., Disp: , Rfl:  .   levalbuterol (XOPENEX) 1.25 MG/3ML nebulizer solution, Take 1.25 mg by nebulization every 4 (four) hours as needed for wheezing., Disp: 72 mL, Rfl: 12 .  mupirocin ointment (BACTROBAN) 2 %, Apply to wound twice a day., Disp: 30 g, Rfl: 2 .  naproxen (NAPROSYN) 375 MG tablet, Take 1 tablet (375 mg total) by mouth 2 (two) times daily with a meal., Disp: 60 tablet, Rfl: 3 .  tiZANidine (ZANAFLEX) 4 MG tablet, Take 1 tablet (4 mg total) by mouth every 6 (six) hours as needed for muscle spasms., Disp: 30 tablet, Rfl: 3 .  vitamin B-12 (CYANOCOBALAMIN) 500 MCG tablet, Take 500 mcg by mouth daily., Disp: , Rfl:   Review of Systems  Constitutional: Positive for fatigue. Negative for activity change, appetite change, chills, diaphoresis, fever and unexpected weight change.  HENT: Positive for congestion. Negative for ear discharge, ear pain, nosebleeds, postnasal drip, rhinorrhea, sinus pain, sinus pressure, sore throat, tinnitus and trouble swallowing.   Eyes: Positive for redness. Negative for photophobia, pain, discharge, itching and visual disturbance.  Respiratory: Positive for cough, shortness of breath and wheezing. Negative for apnea, choking, chest tightness and stridor.   Cardiovascular: Negative.   Gastrointestinal: Negative.   Neurological: Negative for dizziness, light-headedness and headaches.    Social History  Substance Use Topics  . Smoking status: Never Smoker  . Smokeless tobacco: Never Used  . Alcohol use No   Objective:   There were no vitals taken for this  visit.  Physical Exam  Constitutional: She appears well-developed and well-nourished.  HENT:  Right Ear: External ear normal.  Left Ear: External ear normal.  Mouth/Throat: Oropharynx is clear and moist. No oropharyngeal exudate.  Eyes: Right eye exhibits no discharge. Left eye exhibits no discharge.  Neck: Neck supple.  Cardiovascular: Normal rate and regular rhythm.   Pulmonary/Chest: Effort normal. No respiratory  distress. She has wheezes. She has no rales.  Lymphadenopathy:    She has no cervical adenopathy.  Skin: Skin is warm and dry.  Psychiatric: She has a normal mood and affect. Her behavior is normal.        Assessment & Plan:      Problem List Items Addressed This Visit    None    Visit Diagnoses    Cough    -  Primary   Relevant Medications   doxycycline (VIBRA-TABS) 100 MG tablet   benzonatate (TESSALON) 100 MG capsule     Evaluate and treat as above. Patient to call back if not feeling better.  The entirety of the information documented in the History of Present Illness, Review of Systems and Physical Exam were personally obtained by me. Portions of this information were initially documented by Ashley Royalty, CMA and reviewed by me for thoroughness and accuracy.    Patient Instructions  Acute Bronchitis Bronchitis is inflammation of the airways that extend from the windpipe into the lungs (bronchi). The inflammation often causes mucus to develop. This leads to a cough, which is the most common symptom of bronchitis.  In acute bronchitis, the condition usually develops suddenly and goes away over time, usually in a couple weeks. Smoking, allergies, and asthma can make bronchitis worse. Repeated episodes of bronchitis may cause further lung problems.  CAUSES Acute bronchitis is most often caused by the same virus that causes a cold. The virus can spread from person to person (contagious) through coughing, sneezing, and touching contaminated objects. SIGNS AND SYMPTOMS   Cough.   Fever.   Coughing up mucus.   Body aches.   Chest congestion.   Chills.   Shortness of breath.   Sore throat.  DIAGNOSIS  Acute bronchitis is usually diagnosed through a physical exam. Your health care provider will also ask you questions about your medical history. Tests, such as chest X-rays, are sometimes done to rule out other conditions.  TREATMENT  Acute bronchitis usually goes  away in a couple weeks. Oftentimes, no medical treatment is necessary. Medicines are sometimes given for relief of fever or cough. Antibiotic medicines are usually not needed but may be prescribed in certain situations. In some cases, an inhaler may be recommended to help reduce shortness of breath and control the cough. A cool mist vaporizer may also be used to help thin bronchial secretions and make it easier to clear the chest.  HOME CARE INSTRUCTIONS  Get plenty of rest.   Drink enough fluids to keep your urine clear or pale yellow (unless you have a medical condition that requires fluid restriction). Increasing fluids may help thin your respiratory secretions (sputum) and reduce chest congestion, and it will prevent dehydration.   Take medicines only as directed by your health care provider.  If you were prescribed an antibiotic medicine, finish it all even if you start to feel better.  Avoid smoking and secondhand smoke. Exposure to cigarette smoke or irritating chemicals will make bronchitis worse. If you are a smoker, consider using nicotine gum or skin patches to help control withdrawal symptoms.  Quitting smoking will help your lungs heal faster.   Reduce the chances of another bout of acute bronchitis by washing your hands frequently, avoiding people with cold symptoms, and trying not to touch your hands to your mouth, nose, or eyes.   Keep all follow-up visits as directed by your health care provider.  SEEK MEDICAL CARE IF: Your symptoms do not improve after 1 week of treatment.  SEEK IMMEDIATE MEDICAL CARE IF:  You develop an increased fever or chills.   You have chest pain.   You have severe shortness of breath.  You have bloody sputum.   You develop dehydration.  You faint or repeatedly feel like you are going to pass out.  You develop repeated vomiting.  You develop a severe headache. MAKE SURE YOU:   Understand these instructions.  Will watch your  condition.  Will get help right away if you are not doing well or get worse.   This information is not intended to replace advice given to you by your health care provider. Make sure you discuss any questions you have with your health care provider.   Document Released: 01/22/2005 Document Revised: 01/05/2015 Document Reviewed: 06/07/2013 Elsevier Interactive Patient Education 2016 [employer].    No Follow-up on file.        Trinna Post, PA-C  [facility]

## 2016-11-24 ENCOUNTER — Encounter: Payer: Self-pay | Admitting: Physician Assistant

## 2016-11-24 ENCOUNTER — Telehealth: Payer: Self-pay | Admitting: Physician Assistant

## 2016-11-24 ENCOUNTER — Ambulatory Visit (INDEPENDENT_AMBULATORY_CARE_PROVIDER_SITE_OTHER): Payer: PPO | Admitting: Physician Assistant

## 2016-11-24 ENCOUNTER — Ambulatory Visit
Admission: RE | Admit: 2016-11-24 | Discharge: 2016-11-24 | Disposition: A | Discharge status: Home / Self Care | Payer: PPO | Source: Ambulatory Visit | Attending: Physician Assistant | Admitting: Physician Assistant

## 2016-11-24 VITALS — BP 116/82 | HR 96 | Temp 97.6°F | Resp 16 | Wt 222.0 lb

## 2016-11-24 DIAGNOSIS — R918 Other nonspecific abnormal finding of lung field: Secondary | ICD-10-CM | POA: Insufficient documentation

## 2016-11-24 DIAGNOSIS — R05 Cough: Secondary | ICD-10-CM | POA: Diagnosis not present

## 2016-11-24 DIAGNOSIS — R059 Cough, unspecified: Secondary | ICD-10-CM

## 2016-11-24 NOTE — Telephone Encounter (Signed)
Would like to have patient come in if she's not feeling better. Will need this to assess if abx are appropriate. Can schedule CXR on this day as well.

## 2016-11-24 NOTE — Progress Notes (Signed)
Patient: Victoria Li Female    DOB: 1946/01/16   70 y.o.   MRN: [id number] Visit Date: 11/24/2016  Today's Provider: Trinna Post, PA-C   Chief Complaint  Patient presents with  . Cough    Started Mid October.   Subjective:    Cough  The current episode started more than 1 month ago. The problem has been unchanged. The cough is productive of sputum. Associated symptoms include eye redness, headaches (Only had a headache one day.), nasal congestion, postnasal drip, rhinorrhea and wheezing. Pertinent negatives include no ear congestion, ear pain, sore throat or shortness of breath.   Patient has history of stable lung nodules on CT scan from 2015. She is also hypoxic at baseline. She has had levalbuterol nebulizer and 10 day course of doxycyline with little relief. Patient denies fever, chills, nausea, vomiting, reduced appetite. Patient has been using nebulizer inhalers with little relief. Her most prominent symptom is cough.      No Known Allergies   Current Outpatient Prescriptions:  .  albuterol (PROVENTIL HFA;VENTOLIN HFA) 108 (90 Base) MCG/ACT inhaler, Inhale 2 puffs into the lungs every 6 (six) hours as needed for wheezing or shortness of breath., Disp: 1 Inhaler, Rfl: 0 .  ascorbic Acid (VITAMIN C) 500 MG CPCR, Take 500 mg by mouth daily., Disp: , Rfl:  .  Cholecalciferol (VITAMIN D3) 1000 UNITS CHEW, Chew by mouth daily., Disp: , Rfl:  .  famotidine (PEPCID) 10 MG tablet, Take 10 mg by mouth daily., Disp: , Rfl:  .  folic acid (FOLVITE) 0.5 MG tablet, Take 0.5 mg by mouth daily., Disp: , Rfl:  .  latanoprost (XALATAN) 0.005 % ophthalmic solution, Place 1 drop into both eyes at bedtime., Disp: , Rfl:  .  levalbuterol (XOPENEX) 1.25 MG/3ML nebulizer solution, Take 1.25 mg by nebulization every 4 (four) hours as needed for wheezing., Disp: 72 mL, Rfl: 12 .  naproxen (NAPROSYN) 375 MG tablet, Take 1 tablet (375 mg total) by mouth 2 (two) times daily with a  meal., Disp: 60 tablet, Rfl: 3 .  tiZANidine (ZANAFLEX) 4 MG tablet, Take 1 tablet (4 mg total) by mouth every 6 (six) hours as needed for muscle spasms., Disp: 30 tablet, Rfl: 3 .  vitamin B-12 (CYANOCOBALAMIN) 500 MCG tablet, Take 500 mcg by mouth daily., Disp: , Rfl:   Review of Systems  Constitutional: Negative.   HENT: Positive for congestion, postnasal drip and rhinorrhea. Negative for ear discharge, ear pain, nosebleeds, sinus pain, sinus pressure, sneezing, sore throat and trouble swallowing.   Eyes: Positive for redness. Negative for photophobia, pain, discharge, itching and visual disturbance.  Respiratory: Positive for cough and wheezing. Negative for apnea, choking, chest tightness, shortness of breath and stridor.   Gastrointestinal: Negative.   Neurological: Positive for headaches (Only had a headache one day.). Negative for dizziness and light-headedness.    Social History  Substance Use Topics  . Smoking status: Never Smoker  . Smokeless tobacco: Never Used  . Alcohol use No   Objective:   BP 116/82 (BP Location: Left Arm, Patient Position: Sitting, Cuff Size: Normal)   Pulse 96   Temp 97.6 F (36.4 C) (Oral)   Resp 16   Wt 222 lb (100.7 kg)   SpO2 91%   BMI 40.60 kg/m   Physical Exam  Constitutional: She appears well-developed and well-nourished.  HENT:  Right Ear: External ear normal.  Left Ear: External ear normal.  Mouth/Throat: Oropharynx is clear  and moist. No oropharyngeal exudate.  Eyes: Right eye exhibits no discharge. Left eye exhibits no discharge.  Neck: Neck supple.  Cardiovascular: Normal rate and regular rhythm.   Pulmonary/Chest: Effort normal. No respiratory distress. She has wheezes. She has no rales.  Scant wheezing in LLL  Lymphadenopathy:    She has no cervical adenopathy.  Skin: Skin is warm and dry.  Psychiatric: She has a normal mood and affect. Her behavior is normal.        Assessment & Plan:      Problem List Items  Addressed This Visit    None    Visit Diagnoses    Cough    -  Primary   Relevant Orders   DG Chest 2 View     Patient is 70 y/o following up for persistent cough. Physical exam remains unchanged. Patient hypoxic at baseline. Will get CXR today to further assess and inform decision making process.   The entirety of the information documented in the History of Present Illness, Review of Systems and Physical Exam were personally obtained by me. Portions of this information were initially documented by Ashley Royalty, CMA and reviewed by me for thoroughness and accuracy.   Return if symptoms worsen or fail to improve.   Patient Instructions  Acute Bronchitis, Adult Acute bronchitis is when air tubes (bronchi) in the lungs suddenly get swollen. The condition can make it hard to breathe. It can also cause these symptoms:  A cough.  Coughing up clear, yellow, or green mucus.  Wheezing.  Chest congestion.  Shortness of breath.  A fever.  Body aches.  Chills.  A sore throat. Follow these instructions at home: Medicines  Take over-the-counter and prescription medicines only as told by your doctor.  If you were prescribed an antibiotic medicine, take it as told by your doctor. Do not stop taking the antibiotic even if you start to feel better. General instructions  Rest.  Drink enough fluids to keep your pee (urine) clear or pale yellow.  Avoid smoking and secondhand smoke. If you smoke and you need help quitting, ask your doctor. Quitting will help your lungs heal faster.  Use an inhaler, cool mist vaporizer, or humidifier as told by your doctor.  Keep all follow-up visits as told by your doctor. This is important. How is this prevented? To lower your risk of getting this condition again:  Wash your hands often with soap and water. If you cannot use soap and water, use hand sanitizer.  Avoid contact with people who have cold symptoms.  Try not to touch your hands to  your mouth, nose, or eyes.  Make sure to get the flu shot every year. Contact a doctor if:  Your symptoms do not get better in 2 weeks. Get help right away if:  You cough up blood.  You have chest pain.  You have very bad shortness of breath.  You become dehydrated.  You faint (pass out) or keep feeling like you are going to pass out.  You keep throwing up (vomiting).  You have a very bad headache.  Your fever or chills gets worse. This information is not intended to replace advice given to you by your health care provider. Make sure you discuss any questions you have with your health care provider. Document Released: 06/02/2008 Document Revised: 07/23/2016 Document Reviewed: 06/04/2016 Elsevier Interactive Patient Education  2017 [facility].

## 2016-11-24 NOTE — Telephone Encounter (Signed)
Pt contacted office for refill request on the following medications: doxycycline (VIBRA-TABS) 100 MG tablet [facility] Last written: 11/07/16 Last OV: 11/07/16 Pt stated that she still isn't feeling much better and would like to get another round of antibiotics. Pt stated that she would like to go ahead and get the chest Xray that she and Adriana discussed at her OV on 11/07/16. Please advise. Thanks TNP

## 2016-11-24 NOTE — Patient Instructions (Signed)

## 2016-11-24 NOTE — Telephone Encounter (Signed)
LMTCB 11/24/2016  Thanks,   -Mickel Baas

## 2016-11-25 ENCOUNTER — Telehealth: Payer: Self-pay | Admitting: Family Medicine

## 2016-11-25 ENCOUNTER — Telehealth: Payer: Self-pay

## 2016-11-25 DIAGNOSIS — R059 Cough, unspecified: Secondary | ICD-10-CM

## 2016-11-25 DIAGNOSIS — R05 Cough: Secondary | ICD-10-CM

## 2016-11-25 NOTE — Telephone Encounter (Signed)
Patient was advised and states that she is very concerned about scarring on her lung. Patient asked what does it mean when you have scarring on the lung? How does this happen? And will it resolve? Patient did agree that she wants prescriptions [facility]. Please advise. KW  CB# [phone]

## 2016-11-25 NOTE — Telephone Encounter (Signed)
Pt is called to request the results of x-ray.  [id number]

## 2016-11-25 NOTE — Telephone Encounter (Signed)
-----   Message from Trinna Post, [place] sent at 11/25/2016 12:07 PM EST ----- CXR shows some lung scarring but otherwise negative for pneumonia. Spoke with Dr. Rosanna Randy, can try another round of antibiotics and some steroids if patient desires. Would patient like this? Please contact via cell.

## 2016-11-25 NOTE — Telephone Encounter (Signed)
[id number] 

## 2016-11-26 ENCOUNTER — Other Ambulatory Visit: Payer: Self-pay | Admitting: Physician Assistant

## 2016-11-26 DIAGNOSIS — R05 Cough: Secondary | ICD-10-CM

## 2016-11-26 DIAGNOSIS — R059 Cough, unspecified: Secondary | ICD-10-CM

## 2016-11-26 MED ORDER — PREDNISONE 20 MG PO TABS: 40.0000 mg | ORAL_TABLET | Freq: Every day | ORAL | 0 refills | Status: AC

## 2016-11-26 MED ORDER — DOXYCYCLINE HYCLATE 100 MG PO TABS: 100.0000 mg | ORAL_TABLET | Freq: Two times a day (BID) | ORAL | 0 refills | Status: DC

## 2016-11-26 NOTE — Telephone Encounter (Signed)
Pt stated she was returning Adriana's call. Thanks TNP

## 2016-11-26 NOTE — Telephone Encounter (Signed)
Patient has been advised. KW 

## 2016-11-26 NOTE — Telephone Encounter (Signed)
Have sent in doxycycline and prednisone. 40 mg prednisone daily for five days. CXR image read by radiologist as "scarring/atelectasis" in the left lower lobe. This is when lung folds in on itself a little bit. These are normal post-infectious changes and usually resolve. No intervention required.

## 2016-11-28 DIAGNOSIS — R0902 Hypoxemia: Secondary | ICD-10-CM | POA: Diagnosis not present

## 2016-11-28 DIAGNOSIS — R05 Cough: Secondary | ICD-10-CM | POA: Diagnosis not present

## 2016-11-28 DIAGNOSIS — R0602 Shortness of breath: Secondary | ICD-10-CM | POA: Diagnosis not present

## 2016-12-04 DIAGNOSIS — R0602 Shortness of breath: Secondary | ICD-10-CM | POA: Diagnosis not present

## 2016-12-12 ENCOUNTER — Ambulatory Visit: Payer: PPO

## 2016-12-12 DIAGNOSIS — R05 Cough: Secondary | ICD-10-CM | POA: Diagnosis not present

## 2017-01-19 DIAGNOSIS — R05 Cough: Secondary | ICD-10-CM | POA: Diagnosis not present

## 2017-02-16 DIAGNOSIS — R05 Cough: Secondary | ICD-10-CM | POA: Diagnosis not present

## 2017-03-12 ENCOUNTER — Telehealth: Payer: Self-pay | Admitting: Family Medicine

## 2017-03-12 NOTE — Telephone Encounter (Signed)
Called Pt to schedule AWV with [facility] - knb °

## 2017-03-30 DIAGNOSIS — M549 Dorsalgia, unspecified: Secondary | ICD-10-CM | POA: Diagnosis not present

## 2017-03-30 DIAGNOSIS — M25552 Pain in left hip: Secondary | ICD-10-CM | POA: Diagnosis not present

## 2017-03-31 ENCOUNTER — Other Ambulatory Visit: Payer: Self-pay | Admitting: Physician Assistant

## 2017-03-31 DIAGNOSIS — S32020G Wedge compression fracture of second lumbar vertebra, subsequent encounter for fracture with delayed healing: Secondary | ICD-10-CM

## 2017-03-31 DIAGNOSIS — M545 Low back pain: Secondary | ICD-10-CM

## 2017-04-02 ENCOUNTER — Telehealth: Payer: Self-pay

## 2017-04-02 NOTE — Telephone Encounter (Signed)
Left message to schedule AWV, anr

## 2017-04-08 DIAGNOSIS — H40013 Open angle with borderline findings, low risk, bilateral: Secondary | ICD-10-CM | POA: Diagnosis not present

## 2017-04-15 ENCOUNTER — Ambulatory Visit
Admission: RE | Admit: 2017-04-15 | Discharge: 2017-04-15 | Disposition: A | Discharge status: Home / Self Care | Payer: PPO | Source: Ambulatory Visit | Attending: Physician Assistant | Admitting: Physician Assistant

## 2017-04-15 DIAGNOSIS — M545 Low back pain: Secondary | ICD-10-CM

## 2017-04-15 DIAGNOSIS — M48061 Spinal stenosis, lumbar region without neurogenic claudication: Secondary | ICD-10-CM | POA: Insufficient documentation

## 2017-04-15 DIAGNOSIS — S32020G Wedge compression fracture of second lumbar vertebra, subsequent encounter for fracture with delayed healing: Secondary | ICD-10-CM

## 2017-04-15 DIAGNOSIS — S32020A Wedge compression fracture of second lumbar vertebra, initial encounter for closed fracture: Secondary | ICD-10-CM | POA: Diagnosis not present

## 2017-04-15 DIAGNOSIS — H40013 Open angle with borderline findings, low risk, bilateral: Secondary | ICD-10-CM | POA: Diagnosis not present

## 2017-04-15 DIAGNOSIS — M5126 Other intervertebral disc displacement, lumbar region: Secondary | ICD-10-CM | POA: Diagnosis not present

## 2017-04-15 DIAGNOSIS — M5136 Other intervertebral disc degeneration, lumbar region: Secondary | ICD-10-CM | POA: Diagnosis not present

## 2017-04-16 ENCOUNTER — Ambulatory Visit
Admission: RE | Admit: 2017-04-16 | Discharge: 2017-04-16 | Disposition: A | Discharge status: Home / Self Care | Payer: PPO | Source: Ambulatory Visit | Attending: Neurosurgery | Admitting: Neurosurgery

## 2017-04-16 ENCOUNTER — Other Ambulatory Visit: Payer: Self-pay | Admitting: Neurosurgery

## 2017-04-16 DIAGNOSIS — M4015 Other secondary kyphosis, thoracolumbar region: Secondary | ICD-10-CM

## 2017-04-16 DIAGNOSIS — G8929 Other chronic pain: Secondary | ICD-10-CM | POA: Diagnosis not present

## 2017-04-16 DIAGNOSIS — M5442 Lumbago with sciatica, left side: Secondary | ICD-10-CM | POA: Diagnosis not present

## 2017-05-18 DIAGNOSIS — M5136 Other intervertebral disc degeneration, lumbar region: Secondary | ICD-10-CM | POA: Diagnosis not present

## 2017-05-18 DIAGNOSIS — M5416 Radiculopathy, lumbar region: Secondary | ICD-10-CM | POA: Diagnosis not present

## 2017-06-15 ENCOUNTER — Encounter: Payer: Self-pay | Admitting: Podiatry

## 2017-06-15 ENCOUNTER — Ambulatory Visit (INDEPENDENT_AMBULATORY_CARE_PROVIDER_SITE_OTHER): Payer: PPO | Admitting: Podiatry

## 2017-06-15 DIAGNOSIS — Q828 Other specified congenital malformations of skin: Secondary | ICD-10-CM

## 2017-06-15 DIAGNOSIS — M205X2 Other deformities of toe(s) (acquired), left foot: Secondary | ICD-10-CM

## 2017-06-15 NOTE — Progress Notes (Signed)
She presents today concerned about the reactive hyperkeratotic lesion dorsal aspect of the first metatarsophalangeal joint of the left foot. She is also concerned about the overlapping of her right hallux over the second toe. She states is painful and which she would like to have that taken care of if possible.  Objective: I have reviewed her past mental history medications allergies surgeries and social history. Pulses are strongly palpable. Neurologic sensorium is intact. Hallux valgus deformities bilateral with overlapping hallux on the second digit right foot with absolutely no plantar flexor capability of the hallux. She also has hallux valgus left foot which is resulting in irritation with her shoe gear. This has resulted in reactive hyperkeratotic overlying the first metatarsophalangeal joint medially.  Assessment: Painful reactive hyperkeratosis painful hallux right.  Plan: Discussed the possible irritation of her right great toe after she returns from her trip in September. I also debrided the reactive hyperkeratotic for her today. She tolerated this well alleviated her symptoms immediately.

## 2017-06-18 ENCOUNTER — Ambulatory Visit: Payer: Self-pay

## 2017-06-24 DIAGNOSIS — M5416 Radiculopathy, lumbar region: Secondary | ICD-10-CM | POA: Diagnosis not present

## 2017-06-24 DIAGNOSIS — M5136 Other intervertebral disc degeneration, lumbar region: Secondary | ICD-10-CM | POA: Diagnosis not present

## 2017-07-13 ENCOUNTER — Other Ambulatory Visit: Payer: Self-pay | Admitting: Family Medicine

## 2017-07-13 DIAGNOSIS — Z1231 Encounter for screening mammogram for malignant neoplasm of breast: Secondary | ICD-10-CM

## 2017-07-15 ENCOUNTER — Ambulatory Visit: Payer: Self-pay

## 2017-07-22 ENCOUNTER — Ambulatory Visit: Payer: Self-pay

## 2017-07-24 ENCOUNTER — Ambulatory Visit
Admission: RE | Admit: 2017-07-24 | Discharge: 2017-07-24 | Disposition: A | Discharge status: Home / Self Care | Payer: PPO | Source: Ambulatory Visit | Attending: Family Medicine | Admitting: Family Medicine

## 2017-07-24 DIAGNOSIS — Z1231 Encounter for screening mammogram for malignant neoplasm of breast: Secondary | ICD-10-CM

## 2017-07-27 ENCOUNTER — Encounter: Payer: Self-pay | Admitting: Podiatry

## 2017-07-27 ENCOUNTER — Ambulatory Visit (INDEPENDENT_AMBULATORY_CARE_PROVIDER_SITE_OTHER): Payer: PPO | Admitting: Podiatry

## 2017-07-27 DIAGNOSIS — Q828 Other specified congenital malformations of skin: Secondary | ICD-10-CM | POA: Diagnosis not present

## 2017-07-27 DIAGNOSIS — M205X2 Other deformities of toe(s) (acquired), left foot: Secondary | ICD-10-CM

## 2017-07-27 NOTE — Progress Notes (Signed)
She presents today for follow-up of a painful porokeratosis dorsal lateral aspect of the first metatarsophalangeal joint left foot. She talks about the right foot and having surgery on it. Should like to consider amputation over fusion because she cannot utilize the knee scooter.  Objective: Pulses are strong. Neurologic sensorium is intact deep tendon reflexes are intact severe hallux valgus deformity right foot demonstrates pain on reduction and on palpation. She also has an underlying dorsal spur first metatarsophalangeal joint right foot resulting in porokeratotic lesion left foot.  Assessment: Severe tibia deformity right porokeratosis left.  Plan: Debrided reactive hyperkeratotic tissue. We discussed the possibility of Keller arthroplasty without implant and possibly converting to a amputation if this didn't work. She will notify me as to her wishes.

## 2017-08-12 ENCOUNTER — Telehealth: Payer: Self-pay | Admitting: Family Medicine

## 2017-08-12 NOTE — Telephone Encounter (Signed)
Left message re need to reschedule AWV and she will call office back-ab

## 2017-09-07 DIAGNOSIS — L237 Allergic contact dermatitis due to plants, except food: Secondary | ICD-10-CM | POA: Diagnosis not present

## 2017-09-07 DIAGNOSIS — R21 Rash and other nonspecific skin eruption: Secondary | ICD-10-CM | POA: Diagnosis not present

## 2017-09-07 DIAGNOSIS — L820 Inflamed seborrheic keratosis: Secondary | ICD-10-CM | POA: Diagnosis not present

## 2017-09-10 ENCOUNTER — Ambulatory Visit (INDEPENDENT_AMBULATORY_CARE_PROVIDER_SITE_OTHER): Payer: PPO | Admitting: Family Medicine

## 2017-09-10 VITALS — BP 114/64 | HR 100 | Temp 98.0°F | Resp 14 | Wt 212.0 lb

## 2017-09-10 DIAGNOSIS — Q796 Ehlers-Danlos syndrome, unspecified: Secondary | ICD-10-CM

## 2017-09-10 DIAGNOSIS — L509 Urticaria, unspecified: Secondary | ICD-10-CM | POA: Diagnosis not present

## 2017-09-10 MED ORDER — RANITIDINE HCL 150 MG PO TABS: 150.0000 mg | ORAL_TABLET | Freq: Two times a day (BID) | ORAL | 1 refills | Status: DC

## 2017-09-10 MED ORDER — PREDNISONE 10 MG (48) PO TBPK: ORAL_TABLET | ORAL | 0 refills | Status: DC

## 2017-09-10 MED ORDER — LORATADINE 10 MG PO TABS: 10.0000 mg | ORAL_TABLET | Freq: Every day | ORAL | 1 refills | Status: DC

## 2017-09-10 NOTE — Progress Notes (Signed)
Victoria Li  MRN: [id number] DOB: [birth year]  Subjective:  HPI  Patient states Thursday last week- 09/03/17- she pulled some weeds around [place] at their home, did not use gloves. She started to feel bad the following day and after with chills, diarrhea, cramping. Then Saturday 09/05/17 she started to itch and had break out on her chest, arms, neck and hands. Redness is present. On Monday she saw Dr Nehemiah Massed and was given a cream and Prednisone-he was not sure what she had. Also she has taking Benadryl. She is not any better with this regimen. Still itching and red. No fever. She is going to [place] next week and needs to feel better.  Patient Active Problem List   Diagnosis Date Noted  . Family history of colon cancer 03/09/2015  . Hypoxemia 12/07/2009  . 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 Calculus of kidney 05/17/2007    Past Medical History:  Diagnosis Date  . Bruises easily   . Ehlers-Danlos syndrome   . Glaucoma   . Swelling     Social History   Social History  . Marital status: Married    Spouse name: N/A  . Number of children: N/A  . Years of education: N/A   Occupational History  . Not on file.   Social History Main Topics  . Smoking status: Never Smoker  . Smokeless tobacco: Never Used  . Alcohol use No  . Drug use: No  . Sexual activity: Not on file   Other Topics Concern  . Not on file   Social History Narrative  . No narrative on file    Outpatient Encounter Prescriptions as of 09/10/2017  Medication Sig  . albuterol (PROVENTIL HFA;VENTOLIN HFA) 108 (90 Base) MCG/ACT inhaler Inhale 2 puffs into the lungs every 6 (six) hours as needed for wheezing or shortness of breath.  Marland Kitchen ascorbic Acid (VITAMIN C) 500 MG CPCR Take 500 mg by mouth daily.  . brimonidine (ALPHAGAN) 0.2 % ophthalmic solution Apply to eye.  . Cholecalciferol (VITAMIN D3) 1000 UNITS CHEW Chew by mouth daily.  . famotidine (PEPCID) 10 MG tablet Take 10 mg by mouth daily.  . folic acid  (FOLVITE) 0.5 MG tablet Take 0.5 mg by mouth daily.  Marland Kitchen latanoprost (XALATAN) 0.005 % ophthalmic solution Place 1 drop into both eyes at bedtime.  . levalbuterol (XOPENEX) 1.25 MG/3ML nebulizer solution Take 1.25 mg by nebulization every 4 (four) hours as needed for wheezing.  . naproxen (NAPROSYN) 375 MG tablet Take 1 tablet (375 mg total) by mouth 2 (two) times daily with a meal.  . vitamin B-12 (CYANOCOBALAMIN) 500 MCG tablet Take 500 mcg by mouth daily.  . [DISCONTINUED] tiZANidine (ZANAFLEX) 4 MG tablet Take 1 tablet (4 mg total) by mouth every 6 (six) hours as needed for muscle spasms.   No facility-administered encounter medications on file as of 09/10/2017.     No Known Allergies  Review of Systems  Constitutional: Positive for malaise/fatigue.  Respiratory: Negative.   Cardiovascular: Negative.   Gastrointestinal: Negative.   Musculoskeletal: Negative.   Skin: Positive for itching and rash.    Objective:  BP 114/64   Pulse 100   Temp 98 F (36.7 C)   Resp 14   Wt 212 lb (96.2 kg)   BMI 38.78 kg/m   Physical Exam  Constitutional: She is oriented to person, place, and time and well-developed, well-nourished, and in no distress.  HENT:  Head: Normocephalic and atraumatic.  Eyes: Pupils are equal, round, and  reactive to light. Conjunctivae are normal.  Neck: Normal range of motion. No thyromegaly present.  Cardiovascular: Normal rate, regular rhythm, normal heart sounds and intact distal pulses.  Exam reveals no gallop.   No murmur heard. Pulmonary/Chest: Effort normal and breath sounds normal. No respiratory distress. She has no wheezes.  Abdominal: Soft. She exhibits no distension. There is no tenderness. There is no rebound.  Neurological: She is alert and oriented to person, place, and time.  Skin: Rash noted. There is erythema.  Psychiatric: Mood, memory, affect and judgment normal.   Assessment and Plan :  1. Urticaria Will check lab work. Since patient is going  out of town will treat with higher dose of Prednisone for 12 days. Follow as needed. - CBC with Differential/Platelet - Comprehensive metabolic panel - predniSONE (STERAPRED UNI-PAK 48 TAB) 10 MG (48) TBPK tablet; As directed  Dispense: 48 tablet; Refill: 0 - loratadine (CLARITIN) 10 MG tablet; Take 1 tablet (10 mg total) by mouth daily.  Dispense: 30 tablet; Refill: 1 - ranitidine (ZANTAC) 150 MG tablet; Take 1 tablet (150 mg total) by mouth 2 (two) times daily.  Dispense: 60 tablet; Refill: 1  2. Ehlers-Danlos syndrome  HPI, Exam and A&P transcribed by Tiffany Kocher, RMA under direction and in the presence of Miguel Aschoff, MD. I have done the exam and reviewed the chart and it is accurate to the best of my knowledge. Development worker, community has been used and  any errors in dictation or transcription are unintentional. Miguel Aschoff M.D. [facility]

## 2017-09-11 LAB — CBC WITH DIFFERENTIAL/PLATELET
Basophils Absolute: 27 cells/uL (ref 0–200)
Basophils Relative: 0.4 %
Eosinophils Absolute: 20 cells/uL (ref 15–500)
Eosinophils Relative: 0.3 %
HCT: 43.6 % (ref 35.0–45.0)
Hemoglobin: 14.7 g/dL (ref 11.7–15.5)
Lymphs Abs: 2217 cells/uL (ref 850–3900)
MCH: 30 pg (ref 27.0–33.0)
MCHC: 33.7 g/dL (ref 32.0–36.0)
MCV: 89 fL (ref 80.0–100.0)
MPV: 11.1 fL (ref 7.5–12.5)
Monocytes Relative: 7 %
Neutro Abs: 4060 cells/uL (ref 1500–7800)
Neutrophils Relative %: 59.7 %
Platelets: 337 10*3/uL (ref 140–400)
RBC: 4.9 10*6/uL (ref 3.80–5.10)
RDW: 12.8 % (ref 11.0–15.0)
Total Lymphocyte: 32.6 %
WBC mixed population: 476 cells/uL (ref 200–950)
WBC: 6.8 10*3/uL (ref 3.8–10.8)

## 2017-09-11 LAB — COMPLETE METABOLIC PANEL WITH GFR
AG Ratio: 1.4 (calc) (ref 1.0–2.5)
ALT: 14 U/L (ref 6–29)
AST: 14 U/L (ref 10–35)
Albumin: 4 g/dL (ref 3.6–5.1)
Alkaline phosphatase (APISO): 68 U/L (ref 33–130)
BUN: 18 mg/dL (ref 7–25)
CO2: 25 mmol/L (ref 20–32)
Calcium: 9.3 mg/dL (ref 8.6–10.4)
Chloride: 105 mmol/L (ref 98–110)
Creat: 0.81 mg/dL (ref 0.50–0.99)
GFR, Est African American: 86 mL/min/{1.73_m2} (ref >=60)
GFR, Est Non African American: 75 mL/min/{1.73_m2} (ref >=60)
Globulin: 2.8 g/dL (calc) (ref 1.9–3.7)
Glucose, Bld: 121 mg/dL — ABNORMAL HIGH (ref 65–99)
Potassium: 4.3 mmol/L (ref 3.5–5.3)
Sodium: 141 mmol/L (ref 135–146)
Total Bilirubin: 0.3 mg/dL (ref 0.2–1.2)
Total Protein: 6.8 g/dL (ref 6.1–8.1)

## 2017-09-14 ENCOUNTER — Telehealth: Payer: Self-pay

## 2017-09-14 NOTE — Telephone Encounter (Signed)
-----   Message from Jerrol Banana., MD sent at 09/13/2017  6:09 PM EDT ----- Labs OK.

## 2017-09-14 NOTE — Telephone Encounter (Signed)
Pt advised.   Thanks,   -Maily Debarge  

## 2017-11-05 ENCOUNTER — Telehealth: Payer: Self-pay | Admitting: Family Medicine

## 2017-11-19 ENCOUNTER — Emergency Department: Payer: PPO

## 2017-11-19 ENCOUNTER — Emergency Department
Admission: EM | Admit: 2017-11-19 | Discharge: 2017-11-19 | Disposition: A | Discharge status: Home / Self Care | Payer: PPO | Attending: Emergency Medicine | Admitting: Emergency Medicine

## 2017-11-19 ENCOUNTER — Encounter: Payer: Self-pay | Admitting: *Deleted

## 2017-11-19 DIAGNOSIS — Q796 Ehlers-Danlos syndrome, unspecified: Secondary | ICD-10-CM

## 2017-11-19 DIAGNOSIS — S61311A Laceration without foreign body of left index finger with damage to nail, initial encounter: Secondary | ICD-10-CM | POA: Diagnosis not present

## 2017-11-19 DIAGNOSIS — Z79899 Other long term (current) drug therapy: Secondary | ICD-10-CM | POA: Diagnosis not present

## 2017-11-19 DIAGNOSIS — S66321A Laceration of extensor muscle, fascia and tendon of left index finger at wrist and hand level, initial encounter: Secondary | ICD-10-CM | POA: Diagnosis not present

## 2017-11-19 DIAGNOSIS — S56422A Laceration of extensor muscle, fascia and tendon of left index finger at forearm level, initial encounter: Secondary | ICD-10-CM | POA: Diagnosis not present

## 2017-11-19 DIAGNOSIS — Y929 Unspecified place or not applicable: Secondary | ICD-10-CM | POA: Insufficient documentation

## 2017-11-19 DIAGNOSIS — S61209A Unspecified open wound of unspecified finger without damage to nail, initial encounter: Secondary | ICD-10-CM

## 2017-11-19 DIAGNOSIS — W260XXA Contact with knife, initial encounter: Secondary | ICD-10-CM | POA: Insufficient documentation

## 2017-11-19 DIAGNOSIS — Y999 Unspecified external cause status: Secondary | ICD-10-CM | POA: Insufficient documentation

## 2017-11-19 DIAGNOSIS — Y9389 Activity, other specified: Secondary | ICD-10-CM | POA: Insufficient documentation

## 2017-11-19 DIAGNOSIS — S56429A Laceration of extensor muscle, fascia and tendon of unspecified finger at forearm level, initial encounter: Secondary | ICD-10-CM

## 2017-11-19 DIAGNOSIS — S6992XA Unspecified injury of left wrist, hand and finger(s), initial encounter: Secondary | ICD-10-CM | POA: Diagnosis present

## 2017-11-19 MED ORDER — LIDOCAINE HCL (PF) 1 % IJ SOLN
10.0000 mL | Freq: Once | INTRAMUSCULAR | Status: AC
  Administered 2017-11-19: 10 mL
  Filled 2017-11-19: qty 10

## 2017-11-19 MED ORDER — SULFAMETHOXAZOLE-TRIMETHOPRIM 800-160 MG PO TABS: 1.0000 | ORAL_TABLET | Freq: Two times a day (BID) | ORAL | 0 refills | Status: DC

## 2017-11-19 MED ORDER — HYDROCODONE-ACETAMINOPHEN 5-325 MG PO TABS: 1.0000 | ORAL_TABLET | ORAL | 0 refills | Status: DC | PRN

## 2017-11-19 MED ORDER — SULFAMETHOXAZOLE-TRIMETHOPRIM 800-160 MG PO TABS
1.0000 | ORAL_TABLET | Freq: Once | ORAL | Status: AC
  Administered 2017-11-19: 1 via ORAL
  Filled 2017-11-19: qty 1

## 2017-11-19 NOTE — ED Notes (Signed)
Per Dr. Mable Paris no emergent inventions at this time. Pt is flex appropriate.

## 2017-11-19 NOTE — ED Notes (Signed)
First Nurse Note: Pt was cutting a [place] and cut her left index finger. Bleeding appears to under control at this time.

## 2017-11-19 NOTE — ED Provider Notes (Signed)
[facility] Emergency Department Provider Note  ____________________________________________  Time seen: Approximately 9:48 PM  I have reviewed the triage vital signs and the nursing notes.   HISTORY  Chief Complaint Laceration    HPI Victoria Li is a 71 y.o. female who presents emergency department complaining of laceration to the second digit of the patient reports that she was using a carving knife cutting meat off of a [place] after Thanksgiving dinner.  Patient reports the knife slipped and lacerated the PIP joint on the dorsal aspect of the hand.  Patient reports "it is serious, I cannot straighten my finger."  Patient denies any numbness or tingling distally.  No medications prior to arrival.  No other injury or complaint  Past Medical History:  Diagnosis Date  . Bruises easily   . Ehlers-Danlos syndrome   . Glaucoma   . Swelling     Patient Active Problem List   Diagnosis Date Noted  . Family history of colon cancer 03/09/2015  . Hypoxemia 12/07/2009  . 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 Calculus of kidney 05/17/2007    Past Surgical History:  Procedure Laterality Date  . BREAST BIOPSY Left    benign  . COLONOSCOPY    . FOOT SURGERY Bilateral 2014   toes removed    Prior to Admission medications   Medication Sig Start Date End Date Taking? Authorizing Provider  albuterol (PROVENTIL HFA;VENTOLIN HFA) 108 (90 Base) MCG/ACT inhaler Inhale 2 puffs into the lungs every 6 (six) hours as needed for wheezing or shortness of breath. 10/22/16   Jerrol Banana., MD  ascorbic Acid (VITAMIN C) 500 MG CPCR Take 500 mg by mouth daily.    [provider]  brimonidine (ALPHAGAN) 0.2 % ophthalmic solution Apply to eye. 03/06/11   [provider]  Cholecalciferol (VITAMIN D3) 1000 UNITS CHEW Chew by mouth daily.    [provider]  famotidine (PEPCID) 10 MG tablet Take 10 mg by mouth daily.    [provider]   folic acid (FOLVITE) 0.5 MG tablet Take 0.5 mg by mouth daily.    [provider]  HYDROcodone-acetaminophen (NORCO/VICODIN) 5-325 MG tablet Take 1 tablet by mouth every 4 (four) hours as needed for moderate pain. 11/19/17   Rhyann Berton, Charline Bills, PA-C  latanoprost (XALATAN) 0.005 % ophthalmic solution Place 1 drop into both eyes at bedtime.    [provider]  levalbuterol Penne Lash) 1.25 MG/3ML nebulizer solution Take 1.25 mg by nebulization every 4 (four) hours as needed for wheezing. 10/22/16   Jerrol Banana., MD  loratadine (CLARITIN) 10 MG tablet Take 1 tablet (10 mg total) by mouth daily. 09/10/17   Jerrol Banana., MD  naproxen (NAPROSYN) 375 MG tablet Take 1 tablet (375 mg total) by mouth 2 (two) times daily with a meal. 03/04/16   Jerrol Banana., MD  predniSONE (STERAPRED UNI-PAK 48 TAB) 10 MG (48) TBPK tablet As directed 09/10/17   Jerrol Banana., MD  ranitidine (ZANTAC) 150 MG tablet Take 1 tablet (150 mg total) by mouth 2 (two) times daily. 09/10/17   Jerrol Banana., MD  sulfamethoxazole-trimethoprim (BACTRIM DS,SEPTRA DS) 800-160 MG tablet Take 1 tablet by mouth 2 (two) times daily. 11/19/17   Llewellyn Schoenberger, Charline Bills, PA-C  vitamin B-12 (CYANOCOBALAMIN) 500 MCG tablet Take 500 mcg by mouth daily.    [provider]    Allergies Patient has no known allergies.  Family History  Problem Relation Age  of Onset  . Colon cancer Mother   . Stroke Mother   . Heart attack Father   . Colon cancer Maternal Aunt   . Breast cancer Maternal Aunt 58  . Diverticulitis Cousin   . Breast cancer Paternal Aunt     Social History Social History   Tobacco Use  . Smoking status: Never Smoker  . Smokeless tobacco: Never Used  Substance Use Topics  . Alcohol use: No  . Drug use: No     Review of Systems  Constitutional: No fever/chills Eyes: No visual changes. Cardiovascular: no chest pain. Respiratory: no cough. No  SOB. Gastrointestinal: No abdominal pain.  No nausea, no vomiting.   Musculoskeletal: For deep laceration to the PIP joint of the second digit left hand with loss of range of motion to the digit. Skin: Negative for rash, abrasions, lacerations, ecchymosis. Neurological: Negative for headaches, focal weakness or numbness. 10-point ROS otherwise negative.  ____________________________________________   PHYSICAL EXAM:  VITAL SIGNS: ED Triage Vitals  Enc Vitals Group     BP 11/19/17 2036 (!) 176/93     Pulse Rate 11/19/17 2036 91     Resp 11/19/17 2036 16     Temp 11/19/17 2036 98.2 F (36.8 C)     Temp Source 11/19/17 2036 Oral     SpO2 11/19/17 2036 95 %     Weight 11/19/17 2039 179 lb (81.2 kg)     Height 11/19/17 2039 5\' 2"  (1.575 m)     Head Circumference --      Peak Flow --      Pain Score 11/19/17 2039 2     Pain Loc --      Pain Edu? --      Excl. in [place]? --      Constitutional: Alert and oriented. Well appearing and in no acute distress. Eyes: Conjunctivae are normal. PERRL. EOMI. Head: Atraumatic. Neck: No stridor.    Cardiovascular: Normal rate, regular rhythm. Normal S1 and S2.  Good peripheral circulation. Respiratory: Normal respiratory effort without tachypnea or retractions. Lungs CTAB. Good air entry to the bases with no decreased or absent breath sounds. Musculoskeletal: Full range of motion to all extremities. No gross deformities appreciated.  Patient second digit of the left hand is bent and constant flexion at the PIP joint.  Patient has gaping open laceration to the dorsal aspect of the digit.  Laceration is approximately 2 cm in length.  Edges are smooth in nature.  No bleeding at this time.  No visible foreign body.  The extensor tendon of the digit is visualized, this is completely transected.  Visualization of the distal aspect is appreciated.  Proximal end of lacerated tendon has retracted and is palpated mid to proximal phalanx.  Patient has full range  of motion to the MCP joint.  Patient has constant flexion and and the ability to perform hyperflexion of the PIP joint but there is no extension of the PIP joint.  Digit is able to be extended passively.  Sensation and cap refill intact distally.  Examination of rest of digits and left hand reveals no other injury. Neurologic:  Normal speech and language. No gross focal neurologic deficits are appreciated.  Skin:  Skin is warm, dry and intact. No rash noted.  See above note for the laceration to the second digit of the left hand. Psychiatric: Mood and affect are normal. Speech and behavior are normal. Patient exhibits appropriate insight and judgement.   ____________________________________________   LABS (all  labs ordered are listed, but only abnormal results are displayed)  Labs Reviewed - No data to display ____________________________________________  EKG   ____________________________________________  RADIOLOGY Diamantina Providence Claris Pech, personally viewed and evaluated these images (plain radiographs) as part of my medical decision making, as well as reviewing the written report by the radiologist.  Dg Hand Complete Left  Result Date: 11/19/2017 CLINICAL DATA:  Finger laceration EXAM: LEFT HAND - COMPLETE 3+ VIEW COMPARISON:  None. FINDINGS: No acute osseous injury or radiopaque foreign body of the left index finger. There is severe degenerative change at the first carpometacarpal joint with surrounding hypertrophic mineralization. There is a boutonniere deformity of the left index finger. No fracture or dislocation. The bones are osteopenic. IMPRESSION: 1. No acute osseous injury of the site of laceration. No radiopaque foreign body. 2. Severe degenerative change at the left first carpometacarpal joint and to near deformity of the left index finger may be related to the patient's reported connective tissue disease. Electronically Signed   By: Ulyses Jarred M.D.   On: 11/19/2017 22:24     ____________________________________________    PROCEDURES  Procedure(s) performed:    Marland KitchenMarland KitchenLaceration Repair Date/Time: 11/19/2017 11:41 PM Performed by: Darletta Moll, PA-C Authorized by: Darletta Moll, PA-C   Consent:    Consent obtained:  Verbal   Consent given by:  Patient   Risks discussed:  Infection, pain, poor wound healing and need for additional repair   Alternatives discussed:  Referral Universal protocol:    Procedure explained and questions answered to patient or proxy's satisfaction: yes     Imaging studies available: yes   Laceration details:    Location:  Finger   Finger location:  L index finger   Length (cm):  2 Repair type:    Repair type:  Complex Pre-procedure details:    Preparation:  Patient was prepped and draped in usual sterile fashion and imaging obtained to evaluate for foreign bodies Exploration:    Hemostasis achieved with:  Direct pressure   Wound exploration: entire depth of wound probed and visualized     Wound extent: tendon damage     Wound extent: no foreign bodies/material noted, no muscle damage noted, no nerve damage noted, no underlying fracture noted and no vascular damage noted     Tendon damage location:  Upper extremity   Upper extremity tendon damage location:  Finger extensor   Finger extensor tendon:  Extensor digitorum   Tendon damage extent:  Complete transection   Tendon repair plan:  Refer for evaluation   Contaminated: no   Treatment:    Area cleansed with:  Betadine   Amount of cleaning:  Extensive   Irrigation solution:  Sterile saline   Irrigation volume:  1L   Irrigation method:  Syringe   Debridement:  None Skin repair:    Repair method:  Sutures   Suture size:  4-0   Suture material:  Nylon   Suture technique:  Simple interrupted   Number of sutures:  12 Approximation:    Approximation:  Close Post-procedure details:    Dressing:  Tube gauze, sterile dressing and splint for  protection   Patient tolerance of procedure:  Tolerated well, no immediate complications      Medications  lidocaine (PF) (XYLOCAINE) 1 % injection 10 mL (10 mLs Infiltration Given 11/19/17 2247)  sulfamethoxazole-trimethoprim (BACTRIM DS,SEPTRA DS) 800-160 MG per tablet 1 tablet (1 tablet Oral Given 11/19/17 2345)     ____________________________________________   INITIAL IMPRESSION /  ASSESSMENT AND PLAN / ED COURSE  Pertinent labs & imaging results that were available during my care of the patient were reviewed by me and considered in my medical decision making (see chart for details).  Review of the [place] CSRS was performed in accordance of the [place] prior to dispensing any controlled drugs.  Clinical Course as of Nov 19 2350  Thu Nov 19, 2017  2348 Patient presented to the emergency department with a significant laceration to the second digit of the left hand.  On initial inspection, patient had a deep laceration to the PIP joint of the second digit.  Complete transection of the extensor tendon was visualized.  Proximal aspect had retracted it was no longer visible.  It was appreciated by palpation to the mid to proximal aspect of the proximal phalanx.  Due to the nature of injury, on call orthopedic surgeon, Dr. Harlow Mares was consulted.  He advised to close laceration, splint, placed on antibiotics and he would follow-up in the office.  Dr. Harlow Mares was advised that patient had Ehlers-Danlos syndrome.  He verbalizes that if patient's case warrants, he will facilitate hand surgery referral.  [JC]    Clinical Course User Index [JC] Makayah Pauli, Charline Bills, PA-C    Patient's diagnosis is consistent with laceration to the second digit of left hand with complete transection of the extensor tendon of the digit.  Differential included fracture versus dislocation versus ligament injury versus ligament laceration.  X-ray reveals no acute fractures or retained foreign body.  Visualization of the digit  reveals complete transection of the extensor tendon which is further confirmed with inability to extend the PIP joint.  Discussed the case with on-call orthopedic surgeon, Dr. Harlow Mares.  He advised closure in the emergency department and orthopedic follow-up.  If patient's case warrants, he will facilitate referral to hand surgery.  Laceration repair as described above.  I was unable to reapproximate extensor tendon due to significant retraction.  Patient's second and third digits are splinted following procedure.  Patient is given first dose of antibiotics in the emergency department and will be discharged with prescription for Bactrim and limited Vicodin..  Patient will follow up with Dr. Harlow Mares for further management of extensor tendon laceration.  Patient is given ED precautions to return to the ED for any worsening or new symptoms.     ____________________________________________  FINAL CLINICAL IMPRESSION(S) / ED DIAGNOSES  Final diagnoses:  Extensor tendon laceration of finger with open wound, initial encounter  Ehlers-Danlos syndrome      NEW MEDICATIONS STARTED DURING THIS VISIT:  ED Discharge Orders        Ordered    sulfamethoxazole-trimethoprim (BACTRIM DS,SEPTRA DS) 800-160 MG tablet  2 times daily     11/19/17 2339    HYDROcodone-acetaminophen (NORCO/VICODIN) 5-325 MG tablet  Every 4 hours PRN     11/19/17 2339          This chart was dictated using voice recognition software/Dragon. Despite best efforts to proofread, errors can occur which can change the meaning. Any change was purely unintentional.    Darletta Moll, PA-C 11/19/17 2354    Lisa Roca, MD 11/22/17 [phone]

## 2017-11-19 NOTE — ED Triage Notes (Addendum)
Pt to ED after cutting left pointer finger with knife. Bleeding under control at this time. Sensation intact.   Pt unable to straighten finger out at this time. Pt cut the top of finger right ar the knuckle.

## 2017-11-19 NOTE — ED Notes (Signed)

## 2017-11-20 NOTE — Patient Outreach (Signed)
Spoke w/ patient per recent ED visit. Patient had laceration on finger due to accidentally cutting her finger during her holiday dinner. Patient states she just got antibiotics today and is aware to follow up w/ PCP for suture removal. Successful letter, 24 nurse line, and know before you go mailed 11/20/17.

## 2017-11-23 DIAGNOSIS — M79645 Pain in left finger(s): Secondary | ICD-10-CM | POA: Diagnosis not present

## 2017-11-23 DIAGNOSIS — S61211A Laceration without foreign body of left index finger without damage to nail, initial encounter: Secondary | ICD-10-CM | POA: Diagnosis not present

## 2017-11-24 DIAGNOSIS — S61211A Laceration without foreign body of left index finger without damage to nail, initial encounter: Secondary | ICD-10-CM | POA: Diagnosis not present

## 2017-11-24 DIAGNOSIS — X58XXXA Exposure to other specified factors, initial encounter: Secondary | ICD-10-CM | POA: Diagnosis not present

## 2017-11-24 DIAGNOSIS — M79645 Pain in left finger(s): Secondary | ICD-10-CM | POA: Diagnosis not present

## 2017-11-24 DIAGNOSIS — S66391A Other injury of extensor muscle, fascia and tendon of left index finger at wrist and hand level, initial encounter: Secondary | ICD-10-CM | POA: Diagnosis not present

## 2017-11-24 DIAGNOSIS — S61412A Laceration without foreign body of left hand, initial encounter: Secondary | ICD-10-CM | POA: Diagnosis not present

## 2017-11-24 DIAGNOSIS — Y999 Unspecified external cause status: Secondary | ICD-10-CM | POA: Diagnosis not present

## 2017-11-24 DIAGNOSIS — L928 Other granulomatous disorders of the skin and subcutaneous tissue: Secondary | ICD-10-CM | POA: Diagnosis not present

## 2017-12-08 DIAGNOSIS — M79645 Pain in left finger(s): Secondary | ICD-10-CM | POA: Diagnosis not present

## 2017-12-14 DIAGNOSIS — M79645 Pain in left finger(s): Secondary | ICD-10-CM | POA: Diagnosis not present

## 2017-12-23 DIAGNOSIS — S61211D Laceration without foreign body of left index finger without damage to nail, subsequent encounter: Secondary | ICD-10-CM | POA: Diagnosis not present

## 2017-12-23 DIAGNOSIS — M79645 Pain in left finger(s): Secondary | ICD-10-CM | POA: Diagnosis not present

## 2017-12-30 DIAGNOSIS — H401111 Primary open-angle glaucoma, right eye, mild stage: Secondary | ICD-10-CM | POA: Diagnosis not present

## 2018-01-08 DIAGNOSIS — M25642 Stiffness of left hand, not elsewhere classified: Secondary | ICD-10-CM | POA: Diagnosis not present

## 2018-01-08 DIAGNOSIS — Z4889 Encounter for other specified surgical aftercare: Secondary | ICD-10-CM | POA: Diagnosis not present

## 2018-01-08 DIAGNOSIS — S61211D Laceration without foreign body of left index finger without damage to nail, subsequent encounter: Secondary | ICD-10-CM | POA: Diagnosis not present

## 2018-01-08 DIAGNOSIS — S61219A Laceration without foreign body of unspecified finger without damage to nail, initial encounter: Secondary | ICD-10-CM | POA: Insufficient documentation

## 2018-01-08 DIAGNOSIS — M79642 Pain in left hand: Secondary | ICD-10-CM | POA: Diagnosis not present

## 2018-01-29 DIAGNOSIS — S61211A Laceration without foreign body of left index finger without damage to nail, initial encounter: Secondary | ICD-10-CM | POA: Diagnosis not present

## 2018-01-29 DIAGNOSIS — M25642 Stiffness of left hand, not elsewhere classified: Secondary | ICD-10-CM | POA: Diagnosis not present

## 2018-02-01 DIAGNOSIS — M79642 Pain in left hand: Secondary | ICD-10-CM | POA: Diagnosis not present

## 2018-02-01 DIAGNOSIS — Z4889 Encounter for other specified surgical aftercare: Secondary | ICD-10-CM | POA: Diagnosis not present

## 2018-02-01 DIAGNOSIS — S61211D Laceration without foreign body of left index finger without damage to nail, subsequent encounter: Secondary | ICD-10-CM | POA: Diagnosis not present

## 2018-03-24 DIAGNOSIS — S20211A Contusion of right front wall of thorax, initial encounter: Secondary | ICD-10-CM | POA: Diagnosis not present

## 2018-03-24 DIAGNOSIS — M79645 Pain in left finger(s): Secondary | ICD-10-CM | POA: Diagnosis not present

## 2018-03-24 DIAGNOSIS — S60012A Contusion of left thumb without damage to nail, initial encounter: Secondary | ICD-10-CM | POA: Diagnosis not present

## 2018-03-24 DIAGNOSIS — S299XXA Unspecified injury of thorax, initial encounter: Secondary | ICD-10-CM | POA: Diagnosis not present

## 2018-04-01 NOTE — Telephone Encounter (Signed)
complete

## 2018-05-30 ENCOUNTER — Encounter: Payer: Self-pay | Admitting: Emergency Medicine

## 2018-05-30 ENCOUNTER — Other Ambulatory Visit: Payer: Self-pay

## 2018-05-30 ENCOUNTER — Emergency Department
Admission: EM | Admit: 2018-05-30 | Discharge: 2018-05-30 | Disposition: A | Discharge status: Home / Self Care | Payer: PPO | Attending: Emergency Medicine | Admitting: Emergency Medicine

## 2018-05-30 DIAGNOSIS — Y939 Activity, unspecified: Secondary | ICD-10-CM | POA: Insufficient documentation

## 2018-05-30 DIAGNOSIS — Y929 Unspecified place or not applicable: Secondary | ICD-10-CM | POA: Insufficient documentation

## 2018-05-30 DIAGNOSIS — S8991XA Unspecified injury of right lower leg, initial encounter: Secondary | ICD-10-CM | POA: Diagnosis present

## 2018-05-30 DIAGNOSIS — W2203XA Walked into furniture, initial encounter: Secondary | ICD-10-CM | POA: Diagnosis not present

## 2018-05-30 DIAGNOSIS — T148XXA Other injury of unspecified body region, initial encounter: Secondary | ICD-10-CM

## 2018-05-30 DIAGNOSIS — S80811A Abrasion, right lower leg, initial encounter: Secondary | ICD-10-CM | POA: Insufficient documentation

## 2018-05-30 DIAGNOSIS — Y999 Unspecified external cause status: Secondary | ICD-10-CM | POA: Insufficient documentation

## 2018-05-30 DIAGNOSIS — Z79899 Other long term (current) drug therapy: Secondary | ICD-10-CM | POA: Insufficient documentation

## 2018-05-30 MED ORDER — BACITRACIN-NEOMYCIN-POLYMYXIN 400-5-5000 EX OINT: 1.0000 "application " | TOPICAL_OINTMENT | Freq: Two times a day (BID) | CUTANEOUS | 0 refills | Status: DC

## 2018-05-30 MED ORDER — MUPIROCIN CALCIUM 2 % EX CREA
TOPICAL_CREAM | Freq: Once | CUTANEOUS | Status: AC
  Administered 2018-05-30: 22:00:00 via TOPICAL
  Filled 2018-05-30 (×2): qty 15

## 2018-05-30 NOTE — ED Notes (Signed)
Patient ambulatory to stat desk with limp noted.  Reports stumbled and leg hit chair, now with pain and laceration to right leg.

## 2018-05-30 NOTE — ED Provider Notes (Signed)
[facility] Emergency Department Provider Note  ____________________________________________  Time seen: Approximately 10:21 PM  I have reviewed the triage vital signs and the nursing notes.   HISTORY  Chief Complaint Leg Pain    HPI Victoria Li is a 72 y.o. female that presents to the emergency department for evaluation of right leg injury.  Patient stumbled and hit her leg on a chair.  Tetanus shot was one year ago.   Past Medical History:  Diagnosis Date  . Bruises easily   . Ehlers-Danlos syndrome   . Glaucoma   . Swelling     Patient Active Problem List   Diagnosis Date Noted  . Family history of colon cancer 03/09/2015  . Hypoxemia 12/07/2009  . 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 Calculus of kidney 05/17/2007    Past Surgical History:  Procedure Laterality Date  . BREAST BIOPSY Left    benign  . COLONOSCOPY    . FOOT SURGERY Bilateral 2014   toes removed    Prior to Admission medications   Medication Sig Start Date End Date Taking? Authorizing Provider  albuterol (PROVENTIL HFA;VENTOLIN HFA) 108 (90 Base) MCG/ACT inhaler Inhale 2 puffs into the lungs every 6 (six) hours as needed for wheezing or shortness of breath. 10/22/16   Jerrol Banana., MD  ascorbic Acid (VITAMIN C) 500 MG CPCR Take 500 mg by mouth daily.    [provider]  brimonidine (ALPHAGAN) 0.2 % ophthalmic solution Apply to eye. 03/06/11   [provider]  Cholecalciferol (VITAMIN D3) 1000 UNITS CHEW Chew by mouth daily.    [provider]  famotidine (PEPCID) 10 MG tablet Take 10 mg by mouth daily.    [provider]  folic acid (FOLVITE) 0.5 MG tablet Take 0.5 mg by mouth daily.    [provider]  HYDROcodone-acetaminophen (NORCO/VICODIN) 5-325 MG tablet Take 1 tablet by mouth every 4 (four) hours as needed for moderate pain. 11/19/17   Cuthriell, Charline Bills, PA-C  latanoprost (XALATAN) 0.005 % ophthalmic solution  Place 1 drop into both eyes at bedtime.    [provider]  levalbuterol Penne Lash) 1.25 MG/3ML nebulizer solution Take 1.25 mg by nebulization every 4 (four) hours as needed for wheezing. 10/22/16   Jerrol Banana., MD  loratadine (CLARITIN) 10 MG tablet Take 1 tablet (10 mg total) by mouth daily. 09/10/17   Jerrol Banana., MD  naproxen (NAPROSYN) 375 MG tablet Take 1 tablet (375 mg total) by mouth 2 (two) times daily with a meal. 03/04/16   Jerrol Banana., MD  neomycin-bacitracin-polymyxin (NEOSPORIN) ointment Apply 1 application topically every 12 (twelve) hours. 05/30/18   Laban Emperor, PA-C  predniSONE (STERAPRED UNI-PAK 48 TAB) 10 MG (48) TBPK tablet As directed 09/10/17   Jerrol Banana., MD  ranitidine (ZANTAC) 150 MG tablet Take 1 tablet (150 mg total) by mouth 2 (two) times daily. 09/10/17   Jerrol Banana., MD  sulfamethoxazole-trimethoprim (BACTRIM DS,SEPTRA DS) 800-160 MG tablet Take 1 tablet by mouth 2 (two) times daily. 11/19/17   Cuthriell, Charline Bills, PA-C  vitamin B-12 (CYANOCOBALAMIN) 500 MCG tablet Take 500 mcg by mouth daily.    [provider]    Allergies Patient has no known allergies.  Family History  Problem Relation Age of Onset  . Colon cancer Mother   . Stroke Mother   . Heart attack Father   . Colon cancer Maternal Aunt   . Breast cancer Maternal  Aunt 58  . Diverticulitis Cousin   . Breast cancer Paternal Aunt     Social History Social History   Tobacco Use  . Smoking status: Never Smoker  . Smokeless tobacco: Never Used  Substance Use Topics  . Alcohol use: No  . Drug use: No     Review of Systems  Cardiovascular: No chest pain. Respiratory: No SOB. Gastrointestinal: No abdominal pain.  No nausea, no vomiting.  Musculoskeletal: Positive for leg pain. Skin: Negative for rash, ecchymosis. Positive for abrasion.  Neurological: Negative for numbness or  tingling   ____________________________________________   PHYSICAL EXAM:  VITAL SIGNS: ED Triage Vitals  Enc Vitals Group     BP 05/30/18 2009 (!) 170/98     Pulse Rate 05/30/18 2009 99     Resp 05/30/18 2009 18     Temp 05/30/18 2009 97.8 F (36.6 C)     Temp Source 05/30/18 2009 Oral     SpO2 05/30/18 2009 94 %     Weight 05/30/18 2008 179 lb (81.2 kg)     Height 05/30/18 2008 5\' 2"  (1.575 m)     Head Circumference --      Peak Flow --      Pain Score 05/30/18 2007 7     Pain Loc --      Pain Edu? --      Excl. in [place]? --      Constitutional: Alert and oriented. Well appearing and in no acute distress. Eyes: Conjunctivae are normal. PERRL. EOMI. Head: Atraumatic. ENT:      Ears:      Nose: No congestion/rhinnorhea.      Mouth/Throat: Mucous membranes are moist.  Neck: No stridor.   Cardiovascular: Normal rate, regular rhythm.  Good peripheral circulation. Respiratory: Normal respiratory effort without tachypnea or retractions. Lungs CTAB. Good air entry to the bases with no decreased or absent breath sounds. Musculoskeletal: Full range of motion to all extremities. No gross deformities appreciated. Neurologic:  Normal speech and language. No gross focal neurologic deficits are appreciated.  Skin:  Skin is warm, dry and intact.  1 cm circular abrasion to right shin.  No active bleeding. No surrounding erythema.  Psychiatric: Mood and affect are normal. Speech and behavior are normal. Patient exhibits appropriate insight and judgement.   ____________________________________________   LABS (all labs ordered are listed, but only abnormal results are displayed)  Labs Reviewed - No data to display ____________________________________________  EKG   ____________________________________________  RADIOLOGY  No results found.  ____________________________________________    PROCEDURES  Procedure(s) performed:    Procedures    Medications  mupirocin  cream (BACTROBAN) 2 % ( Topical Given 05/30/18 2224)     ____________________________________________   INITIAL IMPRESSION / ASSESSMENT AND PLAN / ED COURSE  Pertinent labs & imaging results that were available during my care of the patient were reviewed by me and considered in my medical decision making (see chart for details).  Review of the [place] CSRS was performed in accordance of the [place] prior to dispensing any controlled drugs.    Patient's diagnosis is consistent with abrasion.  Vital signs and exam are reassuring.  Tetanus shot is up-to-date.  No indication for stitches or repair.  Antibiotic ointment was applied and wound was covered.  Patient will be discharged home with prescriptions for neosporin. Patient is to follow up with PCP as directed. Patient is given ED precautions to return to the ED for any worsening or new symptoms.  ____________________________________________  FINAL CLINICAL IMPRESSION(S) / ED DIAGNOSES  Final diagnoses:  Abrasion      NEW MEDICATIONS STARTED DURING THIS VISIT:  ED Discharge Orders        Ordered    neomycin-bacitracin-polymyxin (NEOSPORIN) ointment  Every 12 hours     05/30/18 2155          This chart was dictated using voice recognition software/Dragon. Despite best efforts to proofread, errors can occur which can change the meaning. Any change was purely unintentional.    Laban Emperor, PA-C 05/30/18 2312    Earleen Newport, MD 05/30/18 2330

## 2018-05-30 NOTE — ED Triage Notes (Signed)
Pt arrives POV to triage with c/o right leg laceration. Pt denies LOC at this time. Pt is in NAD.

## 2018-05-30 NOTE — ED Notes (Signed)
Pt discharged to home.  Family member driving.  Discharge instructions reviewed.  Verbalized understanding.  No questions or concerns at this time.  Teach back verified.  Pt in NAD.  No items left in ED.   

## 2018-06-28 DIAGNOSIS — H401111 Primary open-angle glaucoma, right eye, mild stage: Secondary | ICD-10-CM | POA: Diagnosis not present

## 2018-07-05 DIAGNOSIS — H401111 Primary open-angle glaucoma, right eye, mild stage: Secondary | ICD-10-CM | POA: Diagnosis not present

## 2018-07-26 ENCOUNTER — Other Ambulatory Visit: Payer: Self-pay | Admitting: Family Medicine

## 2018-07-26 DIAGNOSIS — Z1231 Encounter for screening mammogram for malignant neoplasm of breast: Secondary | ICD-10-CM

## 2018-08-16 ENCOUNTER — Ambulatory Visit
Admission: RE | Admit: 2018-08-16 | Discharge: 2018-08-16 | Disposition: A | Discharge status: Home / Self Care | Payer: PPO | Source: Ambulatory Visit | Attending: Family Medicine | Admitting: Family Medicine

## 2018-08-16 DIAGNOSIS — Z1231 Encounter for screening mammogram for malignant neoplasm of breast: Secondary | ICD-10-CM | POA: Diagnosis not present

## 2018-09-03 DIAGNOSIS — D485 Neoplasm of uncertain behavior of skin: Secondary | ICD-10-CM | POA: Diagnosis not present

## 2018-09-03 DIAGNOSIS — M67422 Ganglion, left elbow: Secondary | ICD-10-CM | POA: Diagnosis not present

## 2018-12-15 ENCOUNTER — Telehealth: Payer: Self-pay | Admitting: Family Medicine

## 2018-12-15 NOTE — Telephone Encounter (Signed)
I left a message asking the pt to call and schedule AWV-I w/ [facility] McKenzie. VDM (DD)

## 2019-01-03 DIAGNOSIS — H401111 Primary open-angle glaucoma, right eye, mild stage: Secondary | ICD-10-CM | POA: Diagnosis not present

## 2019-01-06 IMAGING — MG MM DIGITAL SCREENING BILAT W/ TOMO W/ CAD
6 of 12 series · 6 of 36 positions shown · non-contrast
Comparison: Previous exam(s).

CLINICAL DATA: Screening.

EXAM:
DIGITAL SCREENING BILATERAL MAMMOGRAM WITH TOMO AND CAD

[L MLO synth-2D (1 of 2)]
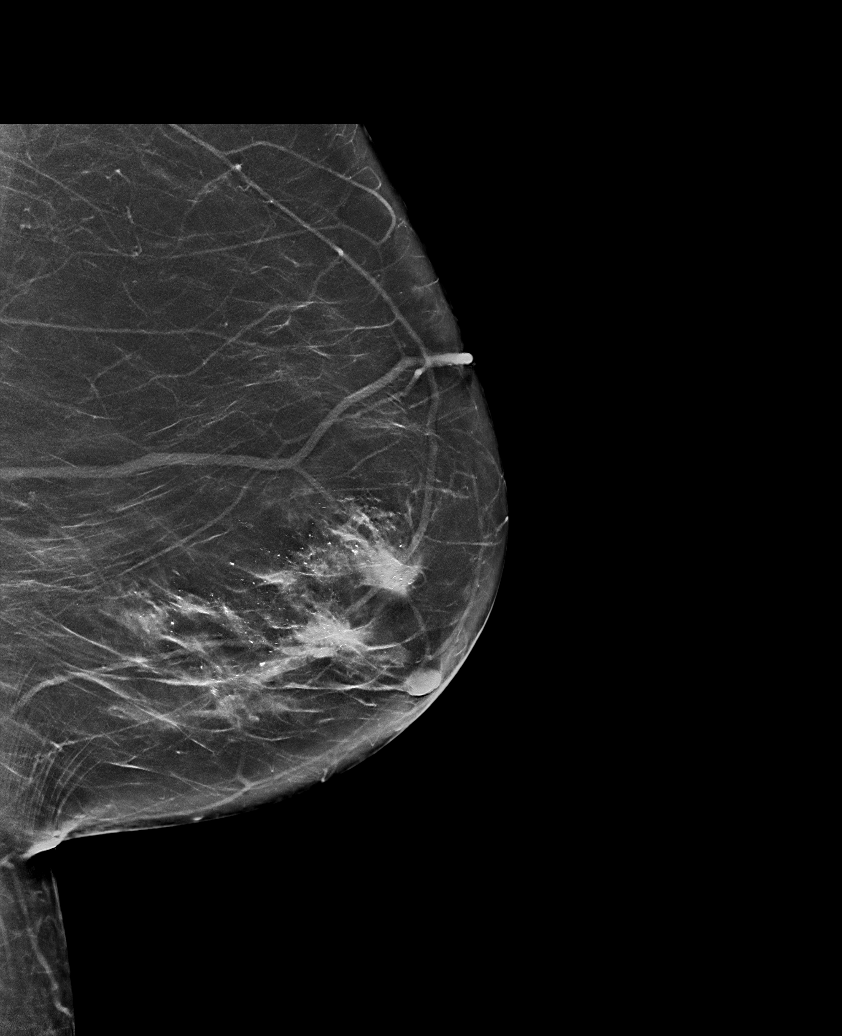

[R CC synth-2D]
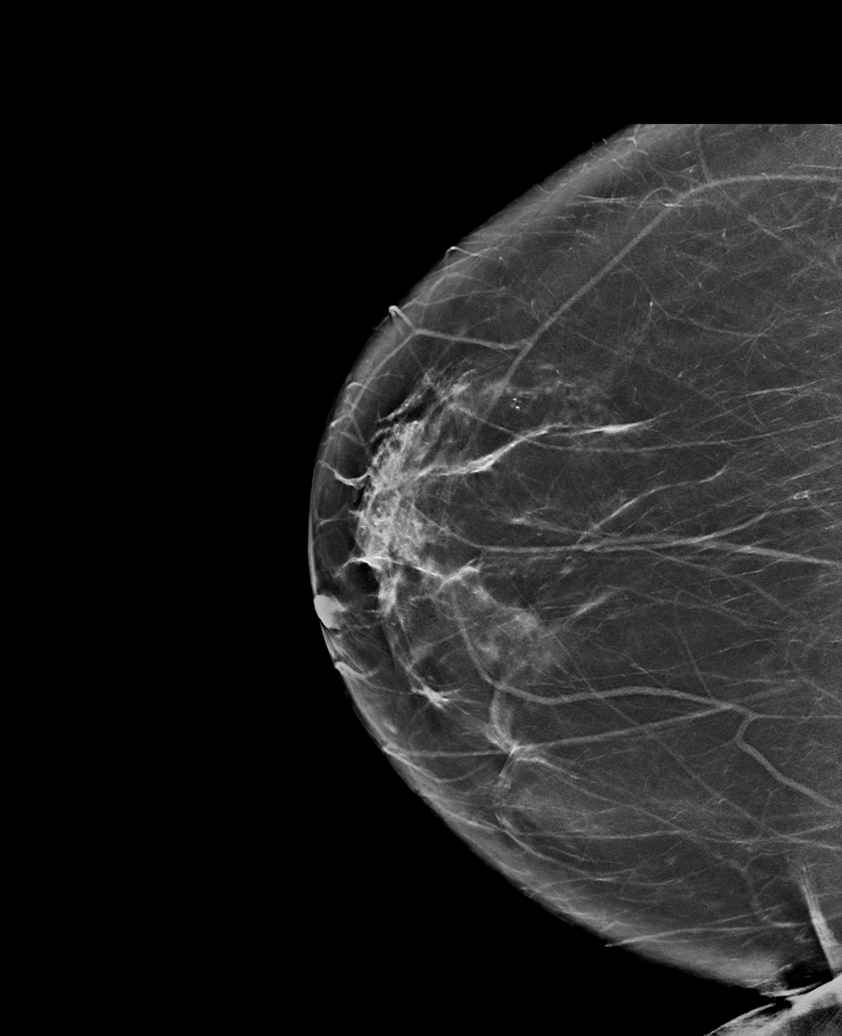

[L MLO synth-2D (2 of 2)]
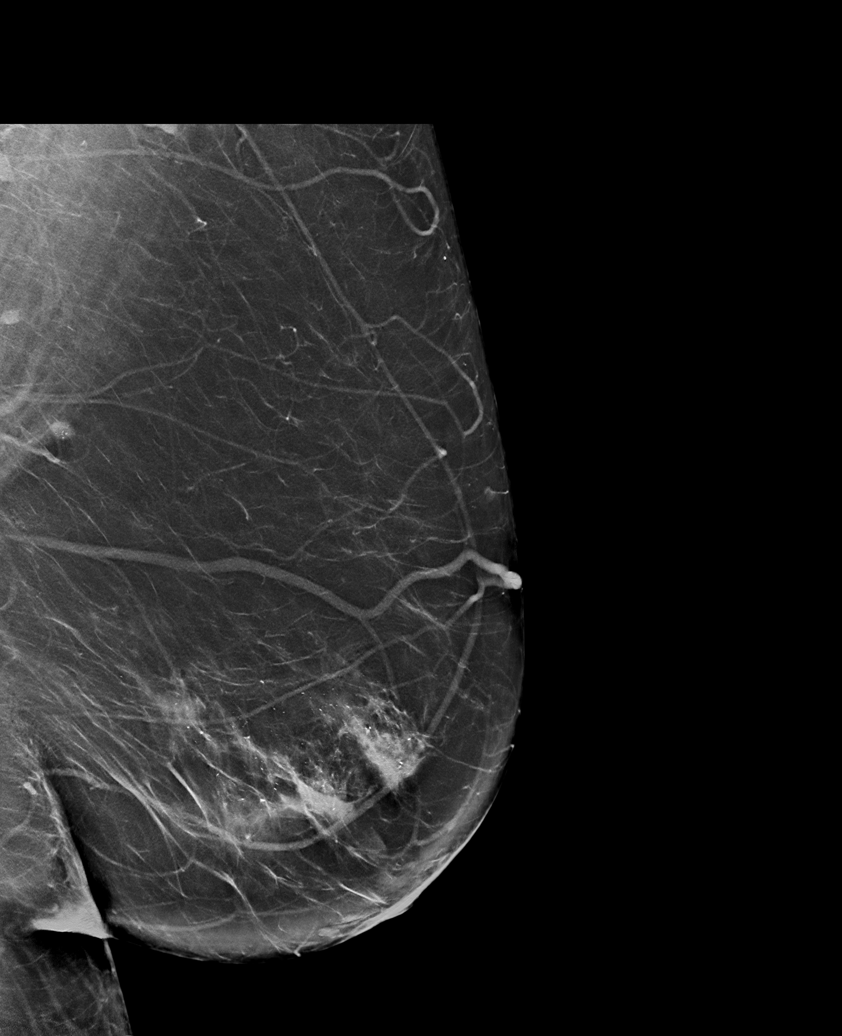

[R MLO synth-2D (1 of 2)]
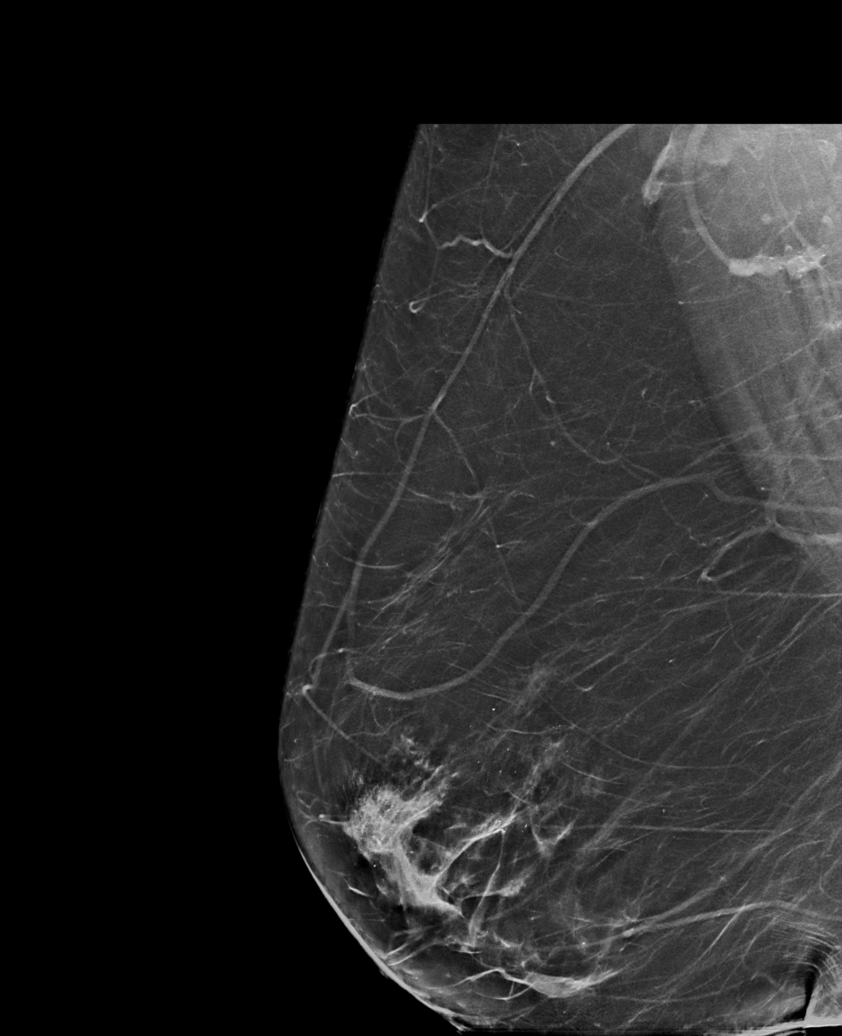

[R MLO synth-2D (2 of 2)]
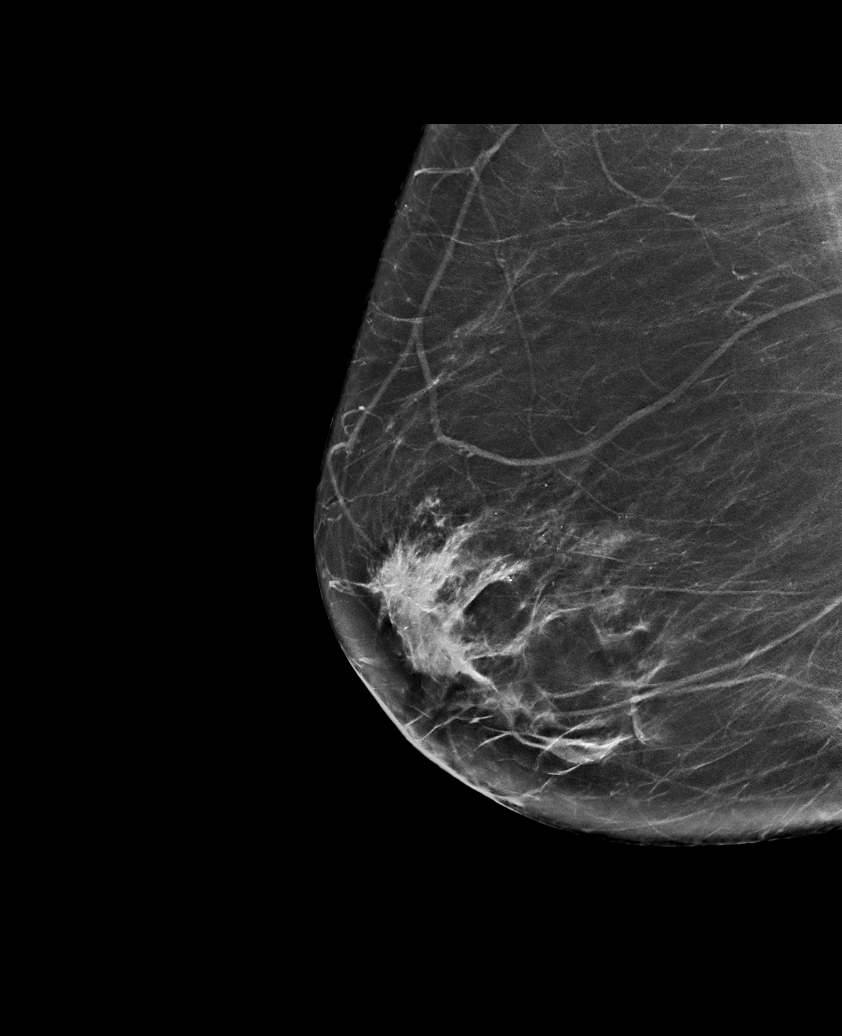

[L CC synth-2D]
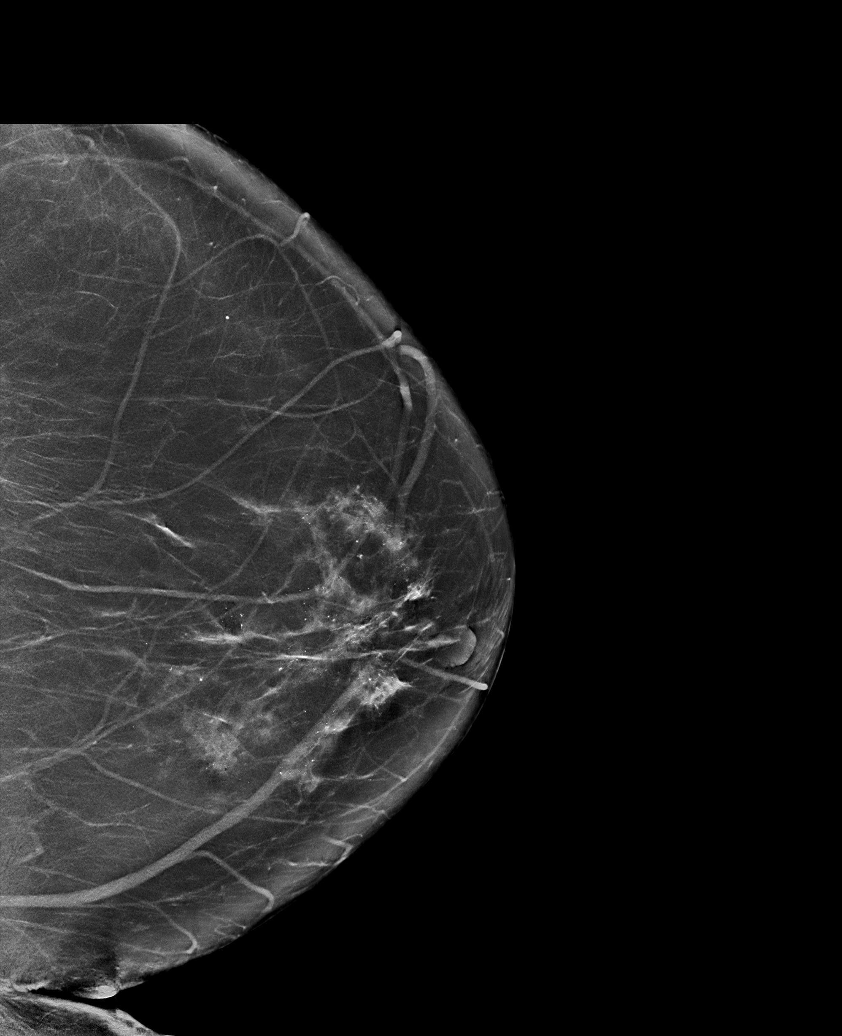

[6 of 36 positions shown; findings below may reference images not displayed]

ACR Breast Density Category b: There are scattered areas of
fibroglandular density.
FINDINGS: There are no findings suspicious for malignancy. Images were
processed with CAD.
IMPRESSION: No mammographic evidence of malignancy. A result letter of this
screening mammogram will be mailed directly to the patient.

RECOMMENDATION:
Screening mammogram in one year. (Code:[id number])

BI-RADS CATEGORY  1: Negative.

## 2019-01-20 DIAGNOSIS — Z711 Person with feared health complaint in whom no diagnosis is made: Secondary | ICD-10-CM | POA: Diagnosis not present

## 2019-06-01 ENCOUNTER — Ambulatory Visit (INDEPENDENT_AMBULATORY_CARE_PROVIDER_SITE_OTHER): Payer: PPO

## 2019-06-01 ENCOUNTER — Ambulatory Visit (INDEPENDENT_AMBULATORY_CARE_PROVIDER_SITE_OTHER): Payer: PPO | Admitting: Podiatry

## 2019-06-01 ENCOUNTER — Other Ambulatory Visit: Payer: Self-pay

## 2019-06-01 ENCOUNTER — Encounter: Payer: Self-pay | Admitting: Podiatry

## 2019-06-01 VITALS — Temp 98.0°F

## 2019-06-01 DIAGNOSIS — M778 Other enthesopathies, not elsewhere classified: Secondary | ICD-10-CM

## 2019-06-01 DIAGNOSIS — M779 Enthesopathy, unspecified: Secondary | ICD-10-CM

## 2019-06-01 MED ORDER — DICLOFENAC SODIUM 1 % TD GEL: 4.0000 g | Freq: Four times a day (QID) | TRANSDERMAL | 2 refills | Status: DC

## 2019-06-01 NOTE — Progress Notes (Signed)
She presents today complaining of pain to the dorsal lateral aspect of her left foot with some swelling.  States that when she is up on the foot it is exquisitely painful until she starts to move and then the pain goes away and stays away until she is seated again for period of time.  Objective: Vital signs are stable alert and oriented x3.  Pulses are palpable.  Severe osteoarthritis with severe pes planus left foot.  Radiographs taken today demonstrate severe osteoarthritis with history of amputations to toes.  Severe pes planus.  Assessment: Osteoarthritis pes planus subtalar joint capsulitis.  Plan: Topical diclofenac gel was prescribed.  Follow-up with me as needed.  Offered a cortisone injection she declined.

## 2019-07-11 DIAGNOSIS — H401111 Primary open-angle glaucoma, right eye, mild stage: Secondary | ICD-10-CM | POA: Diagnosis not present

## 2019-07-18 ENCOUNTER — Other Ambulatory Visit: Payer: Self-pay | Admitting: Family Medicine

## 2019-07-18 DIAGNOSIS — Z1231 Encounter for screening mammogram for malignant neoplasm of breast: Secondary | ICD-10-CM

## 2019-07-25 DIAGNOSIS — H401111 Primary open-angle glaucoma, right eye, mild stage: Secondary | ICD-10-CM | POA: Diagnosis not present

## 2019-07-28 ENCOUNTER — Encounter: Payer: Self-pay | Admitting: General Surgery

## 2019-07-29 ENCOUNTER — Other Ambulatory Visit: Payer: Self-pay

## 2019-08-04 ENCOUNTER — Telehealth: Payer: Self-pay | Admitting: *Deleted

## 2019-08-04 NOTE — Telephone Encounter (Signed)
Patient called the office and states that she received a letter in the mail and is aware that Dr. Bary Castilla is no longer with the practice.   The patient was in the recalls for a colonoscopy for 03-2020.   Patient wishes to discuss with primary care who needs to handle this in the future since we do not have any other physicians in the office that see for this.   Patient aware to call the office if we can be of further assistance.

## 2019-08-16 DIAGNOSIS — H401132 Primary open-angle glaucoma, bilateral, moderate stage: Secondary | ICD-10-CM | POA: Diagnosis not present

## 2019-08-18 ENCOUNTER — Ambulatory Visit
Admission: RE | Admit: 2019-08-18 | Discharge: 2019-08-18 | Disposition: A | Discharge status: Home / Self Care | Payer: PPO | Source: Ambulatory Visit | Attending: Family Medicine | Admitting: Family Medicine

## 2019-08-18 DIAGNOSIS — Z1231 Encounter for screening mammogram for malignant neoplasm of breast: Secondary | ICD-10-CM | POA: Insufficient documentation

## 2019-09-12 ENCOUNTER — Other Ambulatory Visit: Payer: Self-pay

## 2019-09-12 DIAGNOSIS — R6889 Other general symptoms and signs: Secondary | ICD-10-CM | POA: Diagnosis not present

## 2019-09-12 DIAGNOSIS — Z20822 Contact with and (suspected) exposure to covid-19: Secondary | ICD-10-CM

## 2019-09-13 LAB — NOVEL CORONAVIRUS, NAA: SARS-CoV-2, NAA: NOT DETECTED

## 2019-12-16 DIAGNOSIS — H2513 Age-related nuclear cataract, bilateral: Secondary | ICD-10-CM | POA: Diagnosis not present

## 2019-12-16 DIAGNOSIS — H401132 Primary open-angle glaucoma, bilateral, moderate stage: Secondary | ICD-10-CM | POA: Diagnosis not present

## 2020-02-02 DIAGNOSIS — M170 Bilateral primary osteoarthritis of knee: Secondary | ICD-10-CM | POA: Diagnosis not present

## 2020-02-02 DIAGNOSIS — M2142 Flat foot [pes planus] (acquired), left foot: Secondary | ICD-10-CM | POA: Diagnosis not present

## 2020-02-02 DIAGNOSIS — M25562 Pain in left knee: Secondary | ICD-10-CM | POA: Diagnosis not present

## 2020-02-02 DIAGNOSIS — M25561 Pain in right knee: Secondary | ICD-10-CM | POA: Diagnosis not present

## 2020-02-02 DIAGNOSIS — M2141 Flat foot [pes planus] (acquired), right foot: Secondary | ICD-10-CM | POA: Diagnosis not present

## 2020-02-08 NOTE — Progress Notes (Signed)
Patient: Victoria Li Female    DOB: [birth year]   74 y.o.   MRN: [id number] Visit Date: 02/09/2020  Today's Provider: Wilhemena Durie, MD   Chief Complaint  Patient presents with  . Dysuria   Subjective:     Dysuria  This is a new problem. The current episode started in the past 7 days (6 days). The problem has been unchanged. The quality of the pain is described as burning. The pain is mild. There has been no fever. Associated symptoms include frequency and urgency. Pertinent negatives include no chills, discharge, flank pain, hematuria, hesitancy, nausea, possible pregnancy, sweats or vomiting. Treatments tried: cranberry juice and yogurt  The treatment provided no relief.    Patient has had burning upon urination for 6 days. Patient states she is also having frequency and urgency. Patient has been drinking cranberry juice and eating yogurt to help with symptoms.  Overall she is doing fairly well.  She has had problems with arthritis and glaucoma but her Ehlers-Danlos syndrome seems to be stable. No Known Allergies   Current Outpatient Medications:  .  brimonidine (ALPHAGAN) 0.2 % ophthalmic solution, Apply to eye., Disp: , Rfl:  .  brimonidine-timolol (COMBIGAN) 0.2-0.5 % ophthalmic solution, 1 drop every twelve (12) hours., Disp: , Rfl:  .  Cholecalciferol (VITAMIN D3) 1000 UNITS CHEW, Chew by mouth daily., Disp: , Rfl:  .  diclofenac sodium (VOLTAREN) 1 % GEL, Apply 4 g topically 4 (four) times daily., Disp: 100 g, Rfl: 2 .  famotidine (PEPCID) 10 MG tablet, Take 10 mg by mouth daily., Disp: , Rfl:  .  folic acid (FOLVITE) 0.5 MG tablet, Take 0.5 mg by mouth daily., Disp: , Rfl:  .  latanoprost (XALATAN) 0.005 % ophthalmic solution, Place 1 drop into both eyes at bedtime., Disp: , Rfl:  .  Multiple Vitamin (MULTIVITAMIN PO), Take by mouth., Disp: , Rfl:  .  Multiple Vitamins-Minerals (ZINC PO), Take by mouth., Disp: , Rfl:  .  Probiotic Product (PROBIOTIC PO),  Take by mouth., Disp: , Rfl:  .  vitamin B-12 (CYANOCOBALAMIN) 500 MCG tablet, Take 500 mcg by mouth daily., Disp: , Rfl:   Review of Systems  Constitutional: Negative for appetite change, chills, fatigue and fever.  Respiratory: Negative for chest tightness and shortness of breath.   Cardiovascular: Negative for chest pain and palpitations.  Gastrointestinal: Negative for abdominal pain, nausea and vomiting.  Genitourinary: Positive for dysuria, frequency and urgency. Negative for flank pain, hematuria and hesitancy.  Neurological: Negative for dizziness and weakness.    Social History   Tobacco Use  . Smoking status: Never Smoker  . Smokeless tobacco: Never Used  Substance Use Topics  . Alcohol use: No      Objective:   BP 124/86 (BP Location: Left Arm, Patient Position: Sitting, Cuff Size: Large)   Pulse 81   Temp (!) 97.1 F (36.2 C) (Other (Comment))   Resp 18   Ht 5\' 3"  (1.6 m)   Wt 209 lb (94.8 kg)   SpO2 95%   BMI 37.02 kg/m  Vitals:   02/09/20 1149  BP: 124/86  Pulse: 81  Resp: 18  Temp: (!) 97.1 F (36.2 C)  TempSrc: Other (Comment)  SpO2: 95%  Weight: 209 lb (94.8 kg)  Height: 5\' 3"  (1.6 m)  Body mass index is 37.02 kg/m.   Physical Exam Vitals reviewed.  Constitutional:      Appearance: She is obese.  HENT:  Head: Normocephalic and atraumatic.     Right Ear: External ear normal.     Left Ear: External ear normal.  Eyes:     General: No scleral icterus. Cardiovascular:     Rate and Rhythm: Normal rate and regular rhythm.     Heart sounds: Normal heart sounds.  Pulmonary:     Breath sounds: Normal breath sounds.  Abdominal:     Palpations: Abdomen is soft.     Comments: No CVAT  Skin:    General: Skin is warm and dry.  Neurological:     General: No focal deficit present.     Mental Status: She is alert and oriented to person, place, and time.  Psychiatric:        Mood and Affect: Mood normal.        Behavior: Behavior normal.         Thought Content: Thought content normal.        Judgment: Judgment normal.      No results found for any visits on 02/09/20.     Assessment & Plan    1. Dysuria Start nitrofurantoin 100 MG capsule bid. Increase fluid's to help with urinary symptoms. Also try Cranberry Juice and add over-the-counter AZO for pain.  - POCT urinalysis dipstick - CULTURE, URINE COMPREHENSIVE - CBC w/Diff/Platelet - Comprehensive Metabolic Panel (CMET) - TSH - nitrofurantoin, macrocrystal-monohydrate, (MACROBID) 100 MG capsule; Take 1 capsule (100 mg total) by mouth 2 (two) times daily.  Dispense: 10 capsule; Refill: 0  2. Acute cystitis with hematuria Start nitrofurantoin 100 MG capsule bid. Increase fluid's to help with urinary symptoms. Also try Cranberry Juice and add over-the-counter AZO for pain.  - CULTURE, URINE COMPREHENSIVE - CBC w/Diff/Platelet - Comprehensive Metabolic Panel (CMET) - TSH - nitrofurantoin, macrocrystal-monohydrate, (MACROBID) 100 MG capsule; Take 1 capsule (100 mg total) by mouth 2 (two) times daily.  Dispense: 10 capsule; Refill: 0  3. Urinary frequency Start nitrofurantoin 100 MG capsule bid. Increase fluid's to help with urinary symptoms. Also try Cranberry Juice and add over-the-counter AZO for pain.  - CULTURE, URINE COMPREHENSIVE - CBC w/Diff/Platelet - Comprehensive Metabolic Panel (CMET) - TSH - nitrofurantoin, macrocrystal-monohydrate, (MACROBID) 100 MG capsule; Take 1 capsule (100 mg total) by mouth 2 (two) times daily.  Dispense: 10 capsule; Refill: 0  4. Hyperglycemia  - CBC w/Diff/Platelet - Comprehensive Metabolic Panel (CMET) - TSH  5. Gastroesophageal reflux disease, unspecified whether esophagitis present  - CBC w/Diff/Platelet - Comprehensive Metabolic Panel (CMET) - TSH 6.Ehlers danlos syndrome    I,Daphna Lafuente,acting as a scribe for Wilhemena Durie, MD.,have documented all relevant documentation on the behalf of Wilhemena Durie,  MD,as directed by  Wilhemena Durie, MD while in the presence of Wilhemena Durie, MD.     Wilhemena Durie, MD  [facility]

## 2020-02-09 ENCOUNTER — Encounter: Payer: Self-pay | Admitting: Family Medicine

## 2020-02-09 ENCOUNTER — Other Ambulatory Visit: Payer: Self-pay

## 2020-02-09 ENCOUNTER — Telehealth: Payer: Self-pay

## 2020-02-09 ENCOUNTER — Ambulatory Visit (INDEPENDENT_AMBULATORY_CARE_PROVIDER_SITE_OTHER): Payer: PPO | Admitting: Family Medicine

## 2020-02-09 VITALS — BP 124/86 | HR 81 | Temp 97.1°F | Resp 18 | Ht 63.0 in | Wt 209.0 lb

## 2020-02-09 DIAGNOSIS — K219 Gastro-esophageal reflux disease without esophagitis: Secondary | ICD-10-CM | POA: Diagnosis not present

## 2020-02-09 DIAGNOSIS — Q796 Ehlers-Danlos syndrome, unspecified: Secondary | ICD-10-CM | POA: Diagnosis not present

## 2020-02-09 DIAGNOSIS — R300 Dysuria: Secondary | ICD-10-CM | POA: Diagnosis not present

## 2020-02-09 DIAGNOSIS — N3001 Acute cystitis with hematuria: Secondary | ICD-10-CM

## 2020-02-09 DIAGNOSIS — M8580 Other specified disorders of bone density and structure, unspecified site: Secondary | ICD-10-CM

## 2020-02-09 DIAGNOSIS — R739 Hyperglycemia, unspecified: Secondary | ICD-10-CM | POA: Diagnosis not present

## 2020-02-09 DIAGNOSIS — R350 Frequency of micturition: Secondary | ICD-10-CM

## 2020-02-09 DIAGNOSIS — Z780 Asymptomatic menopausal state: Secondary | ICD-10-CM

## 2020-02-09 LAB — POCT URINALYSIS DIPSTICK
Appearance: ABNORMAL
Bilirubin, UA: NEGATIVE
Glucose, UA: NEGATIVE
Ketones, UA: NEGATIVE
Nitrite, UA: NEGATIVE
Odor: ABNORMAL
Protein, UA: NEGATIVE
Spec Grav, UA: >=1.03 — AB (ref 1.010–1.025)
Urobilinogen, UA: 0.2 E.U./dL
pH, UA: 6 (ref 5.0–8.0)

## 2020-02-09 MED ORDER — NITROFURANTOIN MONOHYD MACRO 100 MG PO CAPS: 100.0000 mg | ORAL_CAPSULE | Freq: Two times a day (BID) | ORAL | 0 refills | Status: DC

## 2020-02-09 NOTE — Patient Instructions (Signed)
Increase fluid's to help with urinary symptoms. Also try Cranberry Juice and add over-the-counter AZO for pain.

## 2020-02-09 NOTE — Telephone Encounter (Signed)
Copied from [place] [id number]. Topic: General - Other >> Feb 09, 2020  3:21 PM Celene Kras wrote: Reason for CRM: Pt called and is requesting to have a bone density test done. Please advise.

## 2020-02-09 NOTE — Telephone Encounter (Signed)
Please advise? Last bone density 01/29/2016.

## 2020-02-10 LAB — CBC WITH DIFFERENTIAL/PLATELET
Basophils Absolute: 0.1 10*3/uL (ref 0.0–0.2)
Basos: 1 %
EOS (ABSOLUTE): 0.1 10*3/uL (ref 0.0–0.4)
Eos: 2 %
Hematocrit: 43.3 % (ref 34.0–46.6)
Hemoglobin: 15.1 g/dL (ref 11.1–15.9)
Immature Grans (Abs): 0 10*3/uL (ref 0.0–0.1)
Immature Granulocytes: 0 %
Lymphocytes Absolute: 2.6 10*3/uL (ref 0.7–3.1)
Lymphs: 33 %
MCH: 31.1 pg (ref 26.6–33.0)
MCHC: 34.9 g/dL (ref 31.5–35.7)
MCV: 89 fL (ref 79–97)
Monocytes Absolute: 0.8 10*3/uL (ref 0.1–0.9)
Monocytes: 11 %
Neutrophils Absolute: 4.2 10*3/uL (ref 1.4–7.0)
Neutrophils: 53 %
Platelets: 300 10*3/uL (ref 150–450)
RBC: 4.85 x10E6/uL (ref 3.77–5.28)
RDW: 12.9 % (ref 11.7–15.4)
WBC: 7.8 10*3/uL (ref 3.4–10.8)

## 2020-02-10 LAB — COMPREHENSIVE METABOLIC PANEL
ALT: 19 IU/L (ref 0–32)
AST: 14 IU/L (ref 0–40)
Albumin/Globulin Ratio: 1.6 (ref 1.2–2.2)
Albumin: 4.4 g/dL (ref 3.7–4.7)
Alkaline Phosphatase: 86 IU/L (ref 39–117)
BUN/Creatinine Ratio: 21 (ref 12–28)
BUN: 14 mg/dL (ref 8–27)
Bilirubin Total: 0.3 mg/dL (ref 0.0–1.2)
CO2: 23 mmol/L (ref 20–29)
Calcium: 9.5 mg/dL (ref 8.7–10.3)
Chloride: 99 mmol/L (ref 96–106)
Creatinine, Ser: 0.67 mg/dL (ref 0.57–1.00)
GFR calc Af Amer: 102 mL/min/{1.73_m2} (ref >59)
GFR calc non Af Amer: 89 mL/min/{1.73_m2} (ref >59)
Globulin, Total: 2.7 g/dL (ref 1.5–4.5)
Glucose: 79 mg/dL (ref 65–99)
Potassium: 4.5 mmol/L (ref 3.5–5.2)
Sodium: 137 mmol/L (ref 134–144)
Total Protein: 7.1 g/dL (ref 6.0–8.5)

## 2020-02-10 LAB — TSH: TSH: 2.79 u[IU]/mL (ref 0.450–4.500)

## 2020-02-12 LAB — CULTURE, URINE COMPREHENSIVE

## 2020-02-15 NOTE — Telephone Encounter (Signed)
Patient is calling to check the status of a request for a bone density test.  Please advise and call patient to let her know if approved at [phone]

## 2020-02-15 NOTE — Addendum Note (Signed)
Addended by: Minette Headland on: 02/15/2020 03:27 PM   Modules accepted: Orders

## 2020-02-15 NOTE — Telephone Encounter (Signed)
BMD for postmenopausal status and osteopenia

## 2020-03-05 ENCOUNTER — Ambulatory Visit
Admission: RE | Admit: 2020-03-05 | Discharge: 2020-03-05 | Disposition: A | Discharge status: Home / Self Care | Payer: PPO | Source: Ambulatory Visit | Attending: Family Medicine | Admitting: Family Medicine

## 2020-03-05 DIAGNOSIS — M8580 Other specified disorders of bone density and structure, unspecified site: Secondary | ICD-10-CM | POA: Diagnosis present

## 2020-03-05 DIAGNOSIS — Z780 Asymptomatic menopausal state: Secondary | ICD-10-CM | POA: Diagnosis not present

## 2020-03-05 DIAGNOSIS — M85852 Other specified disorders of bone density and structure, left thigh: Secondary | ICD-10-CM | POA: Diagnosis not present

## 2020-03-06 ENCOUNTER — Telehealth: Payer: Self-pay

## 2020-03-06 NOTE — Telephone Encounter (Signed)
Patient advised.

## 2020-03-06 NOTE — Telephone Encounter (Signed)
-----   Message from Jerrol Banana., MD sent at 03/06/2020  2:07 PM EST ----- Osteopenic not osteoporotic.  Take calcium with vitamin D daily.  Walk daily.  Will discuss repeating BMD in 2 to 4 years

## 2020-03-21 NOTE — Progress Notes (Signed)
Subjective:   Victoria Li is a 74 y.o. female who presents for an Initial Medicare Annual Wellness Visit.    This visit is being conducted through telemedicine due to the COVID-19 pandemic. This patient has given me verbal consent via doximity to conduct this visit, patient states they are participating from their home address. Some vital signs may be absent or patient reported.    Patient identification: identified by name, DOB, and current address  Review of Systems    N/A   Cardiac Risk Factors include: advanced age (>52men, >56 women);obesity (BMI >30kg/m2)     Objective:    Today's Vitals   03/22/20 1112  PainSc: 0-No pain   There is no height or weight on file to calculate BMI. Unable to obtain vitals due to visit being conducted via telephonically.   Advanced Directives 03/22/2020 05/30/2018 11/19/2017 03/04/2016  Does Patient Have a Medical Advance Directive? Yes No No Yes  Type of Paramedic of [place];Living will - - Living will  Copy of [place] in Chart? No - copy requested - - -  Would patient like information on creating a medical advance directive? - - No - Patient declined -    Current Medications (verified) Outpatient Encounter Medications as of 03/22/2020  Medication Sig  . brimonidine (ALPHAGAN) 0.2 % ophthalmic solution Place 1 drop into both eyes 2 (two) times daily.   . Calcium Carbonate (CALCIUM 500 PO) Take 500 mg by mouth daily.  . Cholecalciferol (VITAMIN D3) 1000 UNITS CHEW Chew by mouth daily.  . diclofenac sodium (VOLTAREN) 1 % GEL Apply 4 g topically 4 (four) times daily. (Patient taking differently: Apply 4 g topically 4 (four) times daily. As needed)  . Docusate Sodium (STOOL SOFTENER) 100 MG capsule Take 100 mg by mouth daily as needed for constipation.  . famotidine (PEPCID) 10 MG tablet Take 10 mg by mouth daily.  . folic acid (FOLVITE) 0.5 MG tablet Take 0.5 mg by mouth daily.  Marland Kitchen  latanoprost (XALATAN) 0.005 % ophthalmic solution Place 1 drop into both eyes at bedtime.  . Multiple Vitamin (MULTIVITAMIN PO) Take by mouth.  . Probiotic Product (PROBIOTIC PO) Take by mouth daily.   . timolol (TIMOPTIC) 0.5 % ophthalmic solution Place 1 drop into both eyes 2 (two) times daily.   . vitamin B-12 (CYANOCOBALAMIN) 500 MCG tablet Take 500 mcg by mouth daily.  Marland Kitchen zinc gluconate 50 MG tablet Take 50 mg by mouth daily.  . brimonidine-timolol (COMBIGAN) 0.2-0.5 % ophthalmic solution 1 drop every twelve (12) hours.  . Multiple Vitamins-Minerals (ZINC PO) Take by mouth.  . nitrofurantoin, macrocrystal-monohydrate, (MACROBID) 100 MG capsule Take 1 capsule (100 mg total) by mouth 2 (two) times daily. (Patient not taking: Reported on 03/22/2020)   No facility-administered encounter medications on file as of 03/22/2020.    Allergies (verified) Patient has no known allergies.   History: Past Medical History:  Diagnosis Date  . Bruises easily   . Ehlers-Danlos syndrome   . Glaucoma   . Hammer toe   . Swelling    Past Surgical History:  Procedure Laterality Date  . BREAST BIOPSY Left    benign  . COLONOSCOPY    . FOOT SURGERY Bilateral 2014   toes removed  . HAMMER TOE SURGERY     Family History  Problem Relation Age of Onset  . Colon cancer Mother   . Stroke Mother   . Heart attack Father   . Colon cancer  Maternal Aunt   . Breast cancer Maternal Aunt 58  . Diverticulitis Cousin   . Breast cancer Paternal Aunt    Social History   Socioeconomic History  . Marital status: Married    Spouse name: Not on file  . Number of children: 2  . Years of education: Not on file  . Highest education level: Some college, no degree  Occupational History  . Occupation: retired  . Occupation: Environmental education officer with [employer]    Comment: 2 days a week  Tobacco Use  . Smoking status: Never Smoker  . Smokeless tobacco: Never Used  Substance and Sexual Activity  . Alcohol use: Yes     Comment: 1/2 glass of wine rare  . Drug use: No  . Sexual activity: Not on file  Other Topics Concern  . Not on file  Social History Narrative  . Not on file   Social Determinants of Health   Financial Resource Strain: Low Risk   . Difficulty of Paying Living Expenses: Not hard at all  Food Insecurity: No Food Insecurity  . Worried About Charity fundraiser in the Last Year: Never true  . Ran Out of Food in the Last Year: Never true  Transportation Needs: No Transportation Needs  . Lack of Transportation (Medical): No  . Lack of Transportation (Non-Medical): No  Physical Activity: Insufficiently Active  . Days of Exercise per Week: 2 days  . Minutes of Exercise per Session: 10 min  Stress: No Stress Concern Present  . Feeling of Stress : Not at all  Social Connections: Not Isolated  . Frequency of Communication with Friends and Family: More than three times a week  . Frequency of Social Gatherings with Friends and Family: Three times a week  . Attends Religious Services: More than 4 times per year  . Active Member of Clubs or Organizations: Yes  . Attends Archivist Meetings: More than 4 times per year  . Marital Status: Married    Tobacco Counseling Counseling given: Not Answered   Clinical Intake:  Pre-visit preparation completed: Yes  Pain : No/denies pain Pain Score: 0-No pain     Nutritional Status: BMI > 30  Obese Nutritional Risks: None Diabetes: No  How often do you need to have someone help you when you read instructions, pamphlets, or other written materials from your doctor or pharmacy?: 1 - Never  Interpreter Needed?: No  Information entered by :: [facility], LPN   Activities of Daily Living In your present state of health, do you have any difficulty performing the following activities: 03/22/2020  Hearing? N  Vision? N  Comment Has glaucoma in both eyes.  Difficulty concentrating or making decisions? N  Walking or climbing stairs? Y    Comment Due to knee pains and balance issues.  Dressing or bathing? N  Doing errands, shopping? N  Preparing Food and eating ? N  Using the Toilet? N  In the past six months, have you accidently leaked urine? N  Do you have problems with loss of bowel control? N  Managing your Medications? N  Managing your Finances? N  Housekeeping or managing your Housekeeping? N  Some recent data might be hidden     Immunizations and Health Maintenance Immunization History  Administered Date(s) Administered  . Influenza, High Dose Seasonal PF 11/06/2017, 12/06/2018  . Influenza, Seasonal, Injecte, Preservative Fre 01/19/2017  . Influenza,inj,quad, With Preservative 10/29/2017  . Influenza-Unspecified 12/06/2018  . Tdap 11/06/2017  . Zoster 03/01/2012  Health Maintenance Due  Topic Date Due  . Hepatitis C Screening  Never done  . PNA vac Low Risk Adult (1 of 2 - PCV13) Never done    Patient Care Team: Jerrol Banana., MD as PCP - General (Family Medicine) Byrnett, Forest Gleason, MD (General Surgery) [place], Romilda Garret, DPM as Consulting Physician (Podiatry) Oneta Rack, MD (Dermatology) Vicenta Aly, Silas as Referring Physician (Chiropractic Medicine)  Indicate any recent Medical Services you may have received from other than [facility] providers in the past year (date may be approximate).     Assessment:   This is a routine wellness examination for Victoria Li.  Hearing/Vision screen No exam data present  Dietary issues and exercise activities discussed: Current Exercise Habits: Home exercise routine, Type of exercise: treadmill;walking;Other - see comments(rides a stationary bike), Time (Minutes): 10, Frequency (Times/Week): 3, Weekly Exercise (Minutes/Week): 30, Intensity: Mild, Exercise limited by: None identified  Goals    . DIET - INCREASE WATER INTAKE     Recommend to drink at least 6-8 8oz glasses of water per day.      Depression Screen PHQ 2/9 Scores 03/22/2020 09/10/2017   PHQ - 2 Score 0 0    Fall Risk Fall Risk  03/22/2020 07/29/2019 09/10/2017  Falls in the past year? 0 (No Data) No  Comment - Emmi Telephone Survey: data to providers prior to load -  Number falls in past yr: 0 (No Data) -  Comment - Emmi Telephone Survey Actual Response =  -  Injury with Fall? 0 - -   FALL RISK PREVENTION PERTAINING TO THE HOME:  Any stairs in or around the home? Yes  If so, are there any without handrails? No   Home free of loose throw rugs in walkways, pet beds, electrical cords, etc? Yes  Adequate lighting in your home to reduce risk of falls? Yes   ASSISTIVE DEVICES UTILIZED TO PREVENT FALLS:  Life alert? No  Use of a cane, walker or w/c? No  Grab bars in the bathroom? Yes  Shower chair or bench in shower? No  Elevated toilet seat or a handicapped toilet? Yes    TIMED UP AND GO:  Was the test performed? No .     Cognitive Function: Declined today.        Screening Tests Health Maintenance  Topic Date Due  . Hepatitis C Screening  Never done  . PNA vac Low Risk Adult (1 of 2 - PCV13) Never done  . MAMMOGRAM  08/17/2021  . DEXA SCAN  03/05/2025  . COLONOSCOPY  04/17/2025  . TETANUS/TDAP  11/07/2027  . INFLUENZA VACCINE  Completed    Qualifies for Shingles Vaccine? Yes  Zostavax completed 03/01/12. Due for Shingrix. Pt has been advised to call insurance company to determine out of pocket expense. Advised may also receive vaccine at local pharmacy or Health Dept. Verbalized acceptance and understanding.  Tdap: Up to date  Flu Vaccine: Up to date  Pneumococcal Vaccine: Due for Pneumococcal vaccine. Does the patient want to receive this vaccine today?  No . Advised may receive this vaccine at local pharmacy or Health Dept. Aware to provide a copy of the vaccination record if obtained from local pharmacy or Health Dept. Verbalized acceptance and understanding.   Cancer Screenings:  Colorectal Screening: Completed 04/18/15. Repeat every 10 years.    Mammogram: Completed 08/18/19. Repeat every 1-2 years as advised.   Bone Density: Completed 03/05/20. Results reflect OSTEOPENIA. Repeat every 5 years.   Lung  Cancer Screening: (Low Dose CT Chest recommended if Age 51-80 years, 30 pack-year currently smoking OR have quit w/in 15years.) does not qualify.   Additional Screening:  Hepatitis C Screening: does qualify and would like this added to next blood work orders.   Vision Screening: Recommended annual ophthalmology exams for early detection of glaucoma and other disorders of the eye.  Dental Screening: Recommended annual dental exams for proper oral hygiene  Community Resource Referral:  CRR required this visit?  No       Plan:  I have personally reviewed and addressed the Medicare Annual Wellness questionnaire and have noted the following in the patient's chart:  A. Medical and social history B. Use of alcohol, tobacco or illicit drugs  C. Current medications and supplements D. Functional ability and status E.  Nutritional status F.  Physical activity G. Advance directives H. List of other physicians I.  Hospitalizations, surgeries, and ER visits in previous 12 months J.  [place] such as hearing and vision if needed, cognitive and depression L. Referrals and appointments   In addition, I have reviewed and discussed with patient certain preventive protocols, quality metrics, and best practice recommendations. A written personalized care plan for preventive services as well as general preventive health recommendations were provided to patient.   [place] Score, [place]   [id number]  Nurse Health Advisor   Nurse Notes: Pt would like the Hep C lab order add to next visits blood work orders. Pt to check with pharmacy about previously receiving the pneumonia vaccines.

## 2020-03-22 ENCOUNTER — Ambulatory Visit (INDEPENDENT_AMBULATORY_CARE_PROVIDER_SITE_OTHER): Payer: PPO

## 2020-03-22 ENCOUNTER — Other Ambulatory Visit: Payer: Self-pay

## 2020-03-22 DIAGNOSIS — Z0000 Encounter for general adult medical examination without abnormal findings: Secondary | ICD-10-CM

## 2020-03-22 NOTE — Patient Instructions (Signed)
Victoria Li , Thank you for taking time to come for your Medicare Wellness Visit. I appreciate your ongoing commitment to your health goals. Please review the following plan we discussed and let me know if I can assist you in the future.   Screening recommendations/referrals: Colonoscopy: Up to date, due 03/2025 Mammogram: Up to date, due 07/2021 Bone Density: Up to date, due 02/2025 Recommended yearly ophthalmology/optometry visit for glaucoma screening and checkup Recommended yearly dental visit for hygiene and checkup  Vaccinations: Influenza vaccine: Up to date Pneumococcal vaccine: Pt declines today.  Tdap vaccine: Up to date Shingles vaccine: Pt declines today.     Advanced directives: Please bring a copy of your POA (Power of Attorney) and/or Living Will to your next appointment.   Conditions/risks identified: Recommend to drink at least 6-8 8oz glasses of water per day.  Next appointment: None- declined scheduling a f/u with PCP or an AWV for 2022 at this time.    Preventive Care 91 Years and Older, Female Preventive care refers to lifestyle choices and visits with your health care provider that can promote health and wellness. What does preventive care include?  A yearly physical exam. This is also called an annual well check.  Dental exams once or twice a year.  Routine eye exams. Ask your health care provider how often you should have your eyes checked.  Personal lifestyle choices, including:  Daily care of your teeth and gums.  Regular physical activity.  Eating a healthy diet.  Avoiding tobacco and drug use.  Limiting alcohol use.  Practicing safe sex.  Taking low-dose aspirin every day.  Taking vitamin and mineral supplements as recommended by your health care provider. What happens during an annual well check? The services and screenings done by your health care provider during your annual well check will depend on your age, overall health, lifestyle risk  factors, and family history of disease. Counseling  Your health care provider may ask you questions about your:  Alcohol use.  Tobacco use.  Drug use.  Emotional well-being.  Home and relationship well-being.  Sexual activity.  Eating habits.  History of falls.  Memory and ability to understand (cognition).  Work and work Statistician.  Reproductive health. Screening  You may have the following tests or measurements:  Height, weight, and BMI.  Blood pressure.  Lipid and cholesterol levels. These may be checked every 5 years, or more frequently if you are over 23 years old.  Skin check.  Lung cancer screening. You may have this screening every year starting at age 57 if you have a 30-pack-year history of smoking and currently smoke or have quit within the past 15 years.  Fecal occult blood test (FOBT) of the stool. You may have this test every year starting at age 21.  Flexible sigmoidoscopy or colonoscopy. You may have a sigmoidoscopy every 5 years or a colonoscopy every 10 years starting at age 27.  Hepatitis C blood test.  Hepatitis B blood test.  Sexually transmitted disease (STD) testing.  Diabetes screening. This is done by checking your blood sugar (glucose) after you have not eaten for a while (fasting). You may have this done every 1-3 years.  Bone density scan. This is done to screen for osteoporosis. You may have this done starting at age 40.  Mammogram. This may be done every 1-2 years. Talk to your health care provider about how often you should have regular mammograms. Talk with your health care provider about your test results,  treatment options, and if necessary, the need for more tests. Vaccines  Your health care provider may recommend certain vaccines, such as:  Influenza vaccine. This is recommended every year.  Tetanus, diphtheria, and acellular pertussis (Tdap, Td) vaccine. You may need a Td booster every 10 years.  Zoster vaccine. You  may need this after age 67.  Pneumococcal 13-valent conjugate (PCV13) vaccine. One dose is recommended after age 59.  Pneumococcal polysaccharide (PPSV23) vaccine. One dose is recommended after age 63. Talk to your health care provider about which screenings and vaccines you need and how often you need them. This information is not intended to replace advice given to you by your health care provider. Make sure you discuss any questions you have with your health care provider. Document Released: 01/11/2016 Document Revised: 09/03/2016 Document Reviewed: 10/16/2015 Elsevier Interactive Patient Education  2017 [place] Prevention in the Home Falls can cause injuries. They can happen to people of all ages. There are many things you can do to make your home safe and to help prevent falls. What can I do on the outside of my home?  Regularly fix the edges of walkways and driveways and fix any cracks.  Remove anything that might make you trip as you walk through a door, such as a raised step or threshold.  Trim any bushes or trees on the path to your home.  Use bright outdoor lighting.  Clear any walking paths of anything that might make someone trip, such as rocks or tools.  Regularly check to see if handrails are loose or broken. Make sure that both sides of any steps have handrails.  Any raised decks and porches should have guardrails on the edges.  Have any leaves, snow, or ice cleared regularly.  Use sand or salt on walking paths during winter.  Clean up any spills in your garage right away. This includes oil or grease spills. What can I do in the bathroom?  Use night lights.  Install grab bars by the toilet and in the tub and shower. Do not use towel bars as grab bars.  Use non-skid mats or decals in the tub or shower.  If you need to sit down in the shower, use a plastic, non-slip stool.  Keep the floor dry. Clean up any water that spills on the floor as soon as  it happens.  Remove soap buildup in the tub or shower regularly.  Attach bath mats securely with double-sided non-slip rug tape.  Do not have throw rugs and other things on the floor that can make you trip. What can I do in the bedroom?  Use night lights.  Make sure that you have a light by your bed that is easy to reach.  Do not use any sheets or blankets that are too big for your bed. They should not hang down onto the floor.  Have a firm chair that has side arms. You can use this for support while you get dressed.  Do not have throw rugs and other things on the floor that can make you trip. What can I do in the kitchen?  Clean up any spills right away.  Avoid walking on wet floors.  Keep items that you use a lot in easy-to-reach places.  If you need to reach something above you, use a strong step stool that has a grab bar.  Keep electrical cords out of the way.  Do not use floor polish or wax that makes floors  slippery. If you must use wax, use non-skid floor wax.  Do not have throw rugs and other things on the floor that can make you trip. What can I do with my stairs?  Do not leave any items on the stairs.  Make sure that there are handrails on both sides of the stairs and use them. Fix handrails that are broken or loose. Make sure that handrails are as long as the stairways.  Check any carpeting to make sure that it is firmly attached to the stairs. Fix any carpet that is loose or worn.  Avoid having throw rugs at the top or bottom of the stairs. If you do have throw rugs, attach them to the floor with carpet tape.  Make sure that you have a light switch at the top of the stairs and the bottom of the stairs. If you do not have them, ask someone to add them for you. What else can I do to help prevent falls?  Wear shoes that:  Do not have high heels.  Have rubber bottoms.  Are comfortable and fit you well.  Are closed at the toe. Do not wear sandals.  If  you use a stepladder:  Make sure that it is fully opened. Do not climb a closed stepladder.  Make sure that both sides of the stepladder are locked into place.  Ask someone to hold it for you, if possible.  Clearly mark and make sure that you can see:  Any grab bars or handrails.  First and last steps.  Where the edge of each step is.  Use tools that help you move around (mobility aids) if they are needed. These include:  Canes.  Walkers.  Scooters.  Crutches.  Turn on the lights when you go into a dark area. Replace any light bulbs as soon as they burn out.  Set up your furniture so you have a clear path. Avoid moving your furniture around.  If any of your floors are uneven, fix them.  If there are any pets around you, be aware of where they are.  Review your medicines with your doctor. Some medicines can make you feel dizzy. This can increase your chance of falling. Ask your doctor what other things that you can do to help prevent falls. This information is not intended to replace advice given to you by your health care provider. Make sure you discuss any questions you have with your health care provider. Document Released: 10/11/2009 Document Revised: 05/22/2016 Document Reviewed: 01/19/2015 Elsevier Interactive Patient Education  2017 [employer].

## 2020-04-20 DIAGNOSIS — H2513 Age-related nuclear cataract, bilateral: Secondary | ICD-10-CM | POA: Diagnosis not present

## 2020-04-20 DIAGNOSIS — H401132 Primary open-angle glaucoma, bilateral, moderate stage: Secondary | ICD-10-CM | POA: Diagnosis not present

## 2020-06-18 DIAGNOSIS — F4321 Adjustment disorder with depressed mood: Secondary | ICD-10-CM | POA: Diagnosis not present

## 2020-06-18 DIAGNOSIS — R102 Pelvic and perineal pain: Secondary | ICD-10-CM | POA: Diagnosis not present

## 2020-06-18 DIAGNOSIS — Q796 Ehlers-Danlos syndrome, unspecified: Secondary | ICD-10-CM | POA: Insufficient documentation

## 2020-06-19 DIAGNOSIS — Z01419 Encounter for gynecological examination (general) (routine) without abnormal findings: Secondary | ICD-10-CM | POA: Diagnosis not present

## 2020-06-22 ENCOUNTER — Other Ambulatory Visit: Payer: Self-pay | Admitting: Obstetrics and Gynecology

## 2020-06-25 ENCOUNTER — Other Ambulatory Visit: Payer: Self-pay | Admitting: Obstetrics and Gynecology

## 2020-06-25 DIAGNOSIS — N644 Mastodynia: Secondary | ICD-10-CM

## 2020-07-03 ENCOUNTER — Ambulatory Visit
Admission: RE | Admit: 2020-07-03 | Discharge: 2020-07-03 | Disposition: A | Discharge status: Home / Self Care | Payer: PPO | Source: Ambulatory Visit | Attending: Obstetrics and Gynecology | Admitting: Obstetrics and Gynecology

## 2020-07-03 DIAGNOSIS — R928 Other abnormal and inconclusive findings on diagnostic imaging of breast: Secondary | ICD-10-CM | POA: Diagnosis not present

## 2020-07-03 DIAGNOSIS — N644 Mastodynia: Secondary | ICD-10-CM

## 2020-07-19 DIAGNOSIS — R102 Pelvic and perineal pain: Secondary | ICD-10-CM | POA: Diagnosis not present

## 2020-07-23 DIAGNOSIS — Z03818 Encounter for observation for suspected exposure to other biological agents ruled out: Secondary | ICD-10-CM | POA: Diagnosis not present

## 2020-07-23 DIAGNOSIS — Z20822 Contact with and (suspected) exposure to covid-19: Secondary | ICD-10-CM | POA: Diagnosis not present

## 2020-07-26 ENCOUNTER — Other Ambulatory Visit: Payer: Self-pay | Admitting: General Surgery

## 2020-07-26 DIAGNOSIS — Z800 Family history of malignant neoplasm of digestive organs: Secondary | ICD-10-CM | POA: Diagnosis not present

## 2020-08-27 DIAGNOSIS — H2513 Age-related nuclear cataract, bilateral: Secondary | ICD-10-CM | POA: Diagnosis not present

## 2020-08-27 DIAGNOSIS — H401132 Primary open-angle glaucoma, bilateral, moderate stage: Secondary | ICD-10-CM | POA: Diagnosis not present

## 2020-08-29 ENCOUNTER — Other Ambulatory Visit: Payer: Self-pay

## 2020-08-29 ENCOUNTER — Other Ambulatory Visit
Admission: RE | Admit: 2020-08-29 | Discharge: 2020-08-29 | Disposition: A | Discharge status: Home / Self Care | Payer: PPO | Source: Ambulatory Visit | Attending: General Surgery | Admitting: General Surgery

## 2020-08-29 DIAGNOSIS — Z20822 Contact with and (suspected) exposure to covid-19: Secondary | ICD-10-CM | POA: Diagnosis not present

## 2020-08-29 DIAGNOSIS — Z01812 Encounter for preprocedural laboratory examination: Secondary | ICD-10-CM | POA: Insufficient documentation

## 2020-08-29 LAB — SARS CORONAVIRUS 2 (TAT 6-24 HRS): SARS Coronavirus 2: NEGATIVE

## 2020-08-30 ENCOUNTER — Encounter: Payer: Self-pay | Admitting: General Surgery

## 2020-08-30 NOTE — H&P (Signed)
Subjective:     Patient ID: Victoria Li is a 74 y.o. female.  HPI  The following portions of the patient's history were reviewed and updated as appropriate.  This an established patient is here today for: office visit. Patient is here today to discuss a colonoscopy. She believes her last one was done in 2016. Patient reports bowels move okay and usually one bowel movement per day.  The patient is on a 5-year cycle based on her maternal history of colon cancer at age 12.  Review of Systems       Chief Complaint  Patient presents with  . Follow-up    discuss colonoscopy      BP 122/78   Pulse 86   Temp 36.8 C (98.3 F)   Ht 157.5 cm (5\' 2" )   Wt 95.7 kg (211 lb)   LMP 12/30/1963   SpO2 95%   BMI 38.59 kg/m       Past Medical History:  Diagnosis Date  . Chicken pox   . Ehlers-Danlos syndrome   . Glaucoma (increased eye pressure)   . Hammer toe   . Multiple lung nodules on CT           Past Surgical History:  Procedure Laterality Date  . BREAST MASS REMOVED Left 2002  . COLONOSCOPY  04/18/2015   Hyperplastic sigmoid polyps removed.  Marland Kitchen SCAR TISSUE REMOVED MULTIPLE TIMES    . TOES REMOVED Bilateral 2014             OB History    Gravida  4   Para  2   Term  2   Preterm  0   AB  2   Living  2     SAB      TAB      Ectopic      Molar      Multiple      Live Births          Obstetric Comments  Age at first period 53 Age of first pregnancy 58         Social History          Socioeconomic History  . Marital status: Married    Spouse name: Not on file  . Number of children: Not on file  . Years of education: Not on file  . Highest education level: Not on file  Occupational History  . Not on file  Tobacco Use  . Smoking status: Former Smoker    Types: Cigarettes  . Smokeless tobacco: Never Used  Substance and Sexual Activity  . Alcohol use: Yes    Comment: occasionally  . Drug use:  No  . Sexual activity: Yes    Partners: Male    Birth control/protection: Post-menopausal  Other Topics Concern  . Would you please tell us about the people who live in your home, your pets, or anything else important to your social life? Not Asked  Social History Narrative  . Not on file   Social Determinants of Health      Financial Resource Strain:   . Difficulty of Paying Living Expenses:   Food Insecurity:   . Worried About Charity fundraiser in the Last Year:   . Arboriculturist in the Last Year:   Transportation Needs:   . Film/video editor (Medical):   Marland Kitchen Lack of Transportation (Non-Medical):        No Known Allergies  Current Medications  Current Outpatient Medications  Medication Sig Dispense Refill  . calcium carbonate-vitamin D3 (CALTRATE 600+D) 600 mg(1,500mg ) -200 unit tablet Take 1 tablet by mouth once daily.    . cholecalciferol, vitamin D3, 25 mcg (1,000 unit) Chew Take 1 tablet by mouth once daily       . clobetasoL (TEMOVATE) 0.05 % cream Apply topically 2 (two) times daily       . cyanocobalamin (VITAMIN B12) 500 MCG tablet Take 500 mcg by mouth once daily       . famotidine (PEPCID) 10 MG tablet Take 10 mg by mouth nightly as needed       . folic acid (FOLVITE) 1 MG tablet Take 1 mg by mouth once daily       . latanoprost (XALATAN) 0.005 % ophthalmic solution Place 1 drop into both eyes nightly.    . MULTIVIT WITH MINERALS/LUTEIN (MULTIVITAMIN 50 PLUS ORAL) Take 1 tablet by mouth once daily.    . timolol maleate (TIMOPTIC) 0.5 % ophthalmic solution     . brimonidine (ALPHAGAN) 0.2 % ophthalmic solution 1 drop in both eyes 2 times a day  2  . polyethylene glycol (MIRALAX) powder One bottle for colonoscopy prep. Use as directed. 255 g 0   No current facility-administered medications for this visit.      Family History  Problem Relation Age of Onset  . Myocardial Infarction (Heart attack) Mother   . Colon  cancer Mother   . Myocardial Infarction (Heart attack) Father   . Stroke Father   . Diabetes type II Father   . Ehlers-Danlos syndrome Father   . Ehlers-Danlos syndrome Son   . Ehlers-Danlos syndrome Paternal Grandfather   . Breast cancer Maternal Aunt   . Breast cancer Paternal Aunt     Labs and Radiology:  2016 colonoscopy report reviewed. A.: POLYPS, DISTAL DESCENDING X 3; HOT SNARE:  - HYPERPLASTIC POLYPS, MULTIPLE FRAGMENTS.  - NEGATIVE FOR DYSPLASIA AND MALIGNANCY.     Objective:   Physical Exam Constitutional:      Appearance: Normal appearance.  Eyes:     General: No scleral icterus. Cardiovascular:     Rate and Rhythm: Normal rate and regular rhythm.  Pulmonary:     Effort: Pulmonary effort is normal.     Breath sounds: Normal breath sounds.  Musculoskeletal:     Cervical back: Normal range of motion.  Neurological:     Mental Status: She is alert and oriented to person, place, and time.     Cranial Nerves: Cranial nerves are intact.     Motor: Motor function is intact.     Coordination: Coordination is intact.        Assessment:     Candidate for screening colonoscopy.  Home stress secondary to infidelity.  Plan:     Colonoscopy with possible biopsy/polypectomy prn: Information regarding the procedure, including its potential risks and complications (including but not limited to perforation of the bowel, which may require emergency surgery to repair, and bleeding) was verbally given to the patient. Educational information regarding lower intestinal endoscopy was given to the patient. Written instructions for how to complete the bowel prep using Miralax were provided. The importance of drinking ample fluids to avoid dehydration as a result of the prep emphasized.   The majority of the visit was spent reviewing her present home situation and options for management.  Patient to follow up as scheduled and is aware to call for any new issues or  concerns.  The patient is  under a significant amount of stress at home with infidelity and family support issues.  She was notified that if she desired to postpone the exam until things are more stable, this was reasonable.    Entered by Ledell Noss, CMA, acting as a scribe for Dr. Hervey Ard, MD.   The documentation recorded by the scribe accurately reflects the service I personally performed and the decisions made by me.   Robert Bellow, MD FACS

## 2020-08-31 ENCOUNTER — Other Ambulatory Visit: Payer: Self-pay

## 2020-08-31 ENCOUNTER — Encounter: Payer: Self-pay | Admitting: General Surgery

## 2020-08-31 ENCOUNTER — Ambulatory Visit
Admission: AD | Admit: 2020-08-31 | Discharge: 2020-08-31 | Disposition: A | Discharge status: Home / Self Care | Payer: PPO | Attending: General Surgery | Admitting: General Surgery

## 2020-08-31 ENCOUNTER — Ambulatory Visit: Payer: PPO | Admitting: Certified Registered"

## 2020-08-31 ENCOUNTER — Encounter
Admission: AD | Disposition: A | Discharge status: Home / Self Care | Payer: Self-pay | Source: Home / Self Care | Attending: General Surgery

## 2020-08-31 DIAGNOSIS — R0902 Hypoxemia: Secondary | ICD-10-CM | POA: Diagnosis not present

## 2020-08-31 DIAGNOSIS — K219 Gastro-esophageal reflux disease without esophagitis: Secondary | ICD-10-CM | POA: Diagnosis not present

## 2020-08-31 DIAGNOSIS — Z833 Family history of diabetes mellitus: Secondary | ICD-10-CM | POA: Diagnosis not present

## 2020-08-31 DIAGNOSIS — Z791 Long term (current) use of non-steroidal anti-inflammatories (NSAID): Secondary | ICD-10-CM | POA: Insufficient documentation

## 2020-08-31 DIAGNOSIS — F17210 Nicotine dependence, cigarettes, uncomplicated: Secondary | ICD-10-CM | POA: Diagnosis not present

## 2020-08-31 DIAGNOSIS — Z800 Family history of malignant neoplasm of digestive organs: Secondary | ICD-10-CM | POA: Diagnosis not present

## 2020-08-31 DIAGNOSIS — Z89429 Acquired absence of other toe(s), unspecified side: Secondary | ICD-10-CM | POA: Insufficient documentation

## 2020-08-31 DIAGNOSIS — Z85038 Personal history of other malignant neoplasm of large intestine: Secondary | ICD-10-CM | POA: Insufficient documentation

## 2020-08-31 DIAGNOSIS — Z1211 Encounter for screening for malignant neoplasm of colon: Secondary | ICD-10-CM | POA: Insufficient documentation

## 2020-08-31 DIAGNOSIS — Z803 Family history of malignant neoplasm of breast: Secondary | ICD-10-CM | POA: Insufficient documentation

## 2020-08-31 DIAGNOSIS — Q796 Ehlers-Danlos syndrome, unspecified: Secondary | ICD-10-CM | POA: Diagnosis not present

## 2020-08-31 DIAGNOSIS — R918 Other nonspecific abnormal finding of lung field: Secondary | ICD-10-CM | POA: Insufficient documentation

## 2020-08-31 DIAGNOSIS — K621 Rectal polyp: Secondary | ICD-10-CM | POA: Diagnosis not present

## 2020-08-31 DIAGNOSIS — Z8489 Family history of other specified conditions: Secondary | ICD-10-CM | POA: Insufficient documentation

## 2020-08-31 DIAGNOSIS — Z79899 Other long term (current) drug therapy: Secondary | ICD-10-CM | POA: Diagnosis not present

## 2020-08-31 DIAGNOSIS — H409 Unspecified glaucoma: Secondary | ICD-10-CM | POA: Diagnosis not present

## 2020-08-31 DIAGNOSIS — N200 Calculus of kidney: Secondary | ICD-10-CM | POA: Diagnosis not present

## 2020-08-31 DIAGNOSIS — Z8249 Family history of ischemic heart disease and other diseases of the circulatory system: Secondary | ICD-10-CM | POA: Insufficient documentation

## 2020-08-31 DIAGNOSIS — Z823 Family history of stroke: Secondary | ICD-10-CM | POA: Diagnosis not present

## 2020-08-31 DIAGNOSIS — K6389 Other specified diseases of intestine: Secondary | ICD-10-CM | POA: Diagnosis not present

## 2020-08-31 HISTORY — DX: Other nonspecific abnormal finding of lung field: R91.8

## 2020-08-31 HISTORY — PX: COLONOSCOPY WITH PROPOFOL: SHX5780

## 2020-08-31 HISTORY — DX: Varicella without complication: B01.9

## 2020-08-31 SURGERY — COLONOSCOPY WITH PROPOFOL
Anesthesia: General

## 2020-08-31 MED ORDER — PROPOFOL 10 MG/ML IV BOLUS
INTRAVENOUS | Status: AC
  Filled 2020-08-31: qty 40

## 2020-08-31 MED ORDER — SODIUM CHLORIDE 0.9 % IV SOLN: INTRAVENOUS | Status: DC

## 2020-08-31 MED ORDER — PROPOFOL 500 MG/50ML IV EMUL
INTRAVENOUS | Status: DC | PRN
  Administered 2020-08-31: 125 ug/kg/min via INTRAVENOUS

## 2020-08-31 MED ORDER — PROPOFOL 10 MG/ML IV BOLUS
INTRAVENOUS | Status: DC | PRN
  Administered 2020-08-31: 20 mg via INTRAVENOUS
  Administered 2020-08-31: 80 mg via INTRAVENOUS

## 2020-08-31 MED ORDER — LIDOCAINE HCL (CARDIAC) PF 100 MG/5ML IV SOSY
PREFILLED_SYRINGE | INTRAVENOUS | Status: DC | PRN
  Administered 2020-08-31: 100 mg via INTRAVENOUS

## 2020-08-31 NOTE — H&P (Signed)
Victoria Li [id number] 1946/07/27     HPI:  74 y/o woman whose mother had colon cancer at age 47. For screening colonoscopy.  Tolerated prep well.   Medications Prior to Admission  Medication Sig Dispense Refill Last Dose  . brimonidine (ALPHAGAN) 0.2 % ophthalmic solution Place 1 drop into both eyes 2 (two) times daily.    08/30/2020 at Unknown time  . brimonidine-timolol (COMBIGAN) 0.2-0.5 % ophthalmic solution 1 drop every twelve (12) hours.   08/30/2020 at Unknown time  . Calcium Carbonate (CALCIUM 500 PO) Take 500 mg by mouth daily.   08/30/2020 at Unknown time  . Cholecalciferol (VITAMIN D3) 1000 UNITS CHEW Chew by mouth daily.   08/30/2020 at Unknown time  . clobetasol cream (TEMOVATE) 8.18 % Apply 1 application topically 2 (two) times daily.     . famotidine (PEPCID) 10 MG tablet Take 10 mg by mouth daily.   08/30/2020 at Unknown time  . folic acid (FOLVITE) 0.5 MG tablet Take 0.5 mg by mouth daily.   Past Week at Unknown time  . Multiple Vitamin (MULTIVITAMIN PO) Take by mouth.   08/30/2020 at Unknown time  . Multiple Vitamins-Minerals (ZINC PO) Take by mouth.   08/30/2020 at Unknown time  . Probiotic Product (PROBIOTIC PO) Take by mouth daily.    08/30/2020 at Unknown time  . timolol (TIMOPTIC) 0.5 % ophthalmic solution Place 1 drop into both eyes 2 (two) times daily.    08/30/2020 at Unknown time  . vitamin B-12 (CYANOCOBALAMIN) 500 MCG tablet Take 500 mcg by mouth daily.   08/30/2020 at Unknown time  . zinc gluconate 50 MG tablet Take 50 mg by mouth daily.   08/30/2020 at Unknown time  . diclofenac sodium (VOLTAREN) 1 % GEL Apply 4 g topically 4 (four) times daily. (Patient taking differently: Apply 4 g topically 4 (four) times daily. As needed) 100 g 2   . Docusate Sodium (STOOL SOFTENER) 100 MG capsule Take 100 mg by mouth daily as needed for constipation.     Marland Kitchen latanoprost (XALATAN) 0.005 % ophthalmic solution Place 1 drop into both eyes at bedtime.     . nitrofurantoin,  macrocrystal-monohydrate, (MACROBID) 100 MG capsule Take 1 capsule (100 mg total) by mouth 2 (two) times daily. (Patient not taking: Reported on 03/22/2020) 10 capsule 0    No Known Allergies Past Medical History:  Diagnosis Date  . Bruises easily   . Chicken pox   . Ehlers-Danlos syndrome   . Glaucoma   . Hammer toe   . Multiple lung nodules on CT   . Swelling    Past Surgical History:  Procedure Laterality Date  . amputation toe    . BREAST BIOPSY Left    benign  . COLONOSCOPY    . FOOT SURGERY Bilateral 2014   toes removed  . HAMMER TOE SURGERY    . scar tissue removed     Social History   Socioeconomic History  . Marital status: Married    Spouse name: Not on file  . Number of children: 2  . Years of education: Not on file  . Highest education level: Some college, no degree  Occupational History  . Occupation: retired  . Occupation: Environmental education officer with [employer]    Comment: 2 days a week  Tobacco Use  . Smoking status: Light Tobacco Smoker    Types: Cigarettes  . Smokeless tobacco: Never Used  Vaping Use  . Vaping Use: Never used  Substance and Sexual Activity  .  Alcohol use: Yes    Comment: occasionally   . Drug use: No  . Sexual activity: Yes  Other Topics Concern  . Not on file  Social History Narrative  . Not on file   Social Determinants of Health   Financial Resource Strain: Low Risk   . Difficulty of Paying Living Expenses: Not hard at all  Food Insecurity: No Food Insecurity  . Worried About Charity fundraiser in the Last Year: Never true  . Ran Out of Food in the Last Year: Never true  Transportation Needs: No Transportation Needs  . Lack of Transportation (Medical): No  . Lack of Transportation (Non-Medical): No  Physical Activity: Insufficiently Active  . Days of Exercise per Week: 2 days  . Minutes of Exercise per Session: 10 min  Stress: No Stress Concern Present  . Feeling of Stress : Not at all  Social Connections: Socially Integrated  .  Frequency of Communication with Friends and Family: More than three times a week  . Frequency of Social Gatherings with Friends and Family: Three times a week  . Attends Religious Services: More than 4 times per year  . Active Member of Clubs or Organizations: Yes  . Attends Archivist Meetings: More than 4 times per year  . Marital Status: Married  Human resources officer Violence: Not At Risk  . Fear of Current or Ex-Partner: No  . Emotionally Abused: No  . Physically Abused: No  . Sexually Abused: No   Social History   Social History Narrative  . Not on file     ROS: Negative.     PE: HEENT: Negative. Lungs: Clear. Cardio: RR.  Assessment/Plan:  Proceed with planned endoscopy.   Forest Gleason [facility] 08/31/2020

## 2020-08-31 NOTE — Op Note (Signed)
[facility] Patient Name: Victoria Li Procedure Date: 08/31/2020 7:06 AM MRN: [id number] Account #: [id number] Date of Birth: Jul 23, 1946 Admit Type: Outpatient Age: 74 Room: [facility] ENDO ROOM 1 Gender: Female Note Status: Finalized Procedure:             Colonoscopy Indications:           Family history of colon cancer (mother at age 78) Providers:             Robert Bellow, MD Medicines:             Monitored Anesthesia Care Complications:         No immediate complications. Procedure:             Pre-Anesthesia Assessment:                        - Prior to the procedure, a History and Physical was                         performed, and patient medications, allergies and                         sensitivities were reviewed. The patient's tolerance                         of previous anesthesia was reviewed.                        - The risks and benefits of the procedure and the                         sedation options and risks were discussed with the                         patient. All questions were answered and informed                         consent was obtained.                        After obtaining informed consent, the colonoscope was                         passed under direct vision. Throughout the procedure,                         the patient's blood pressure, pulse, and oxygen                         saturations were monitored continuously. The                         Colonoscope was introduced through the anus and                         advanced to the the cecum, identified by appendiceal                         orifice and ileocecal valve. The colonoscopy was  performed without difficulty. The patient tolerated                         the procedure well. The quality of the bowel                         preparation was excellent. Findings:      A 5 mm polyp was found in the rectum. The polyp was sessile.  Biopsies       were taken with a cold forceps for histology.      The retroflexed view of the distal rectum and anal verge was normal and       showed no anal or rectal abnormalities. Impression:            - One 5 mm polyp in the rectum. Biopsied.                        - The distal rectum and anal verge are normal on                         retroflexion view. Recommendation:        - Repeat colonoscopy in 5 years for surveillance. Procedure Code(s):     --- Professional ---                        [phone], Colonoscopy, flexible; with biopsy, single or                         multiple Diagnosis Code(s):     --- Professional ---                        K62.1, Rectal polyp CPT copyright 2019 American Medical Association. All rights reserved. The codes documented in this report are preliminary and upon coder review may  be revised to meet current compliance requirements. Robert Bellow, MD 08/31/2020 8:37:20 AM This report has been signed electronically. Number of Addenda: 0 Note Initiated On: 08/31/2020 7:06 AM Scope Withdrawal Time: 0 hours 9 minutes 34 seconds  Total Procedure Duration: 0 hours 15 minutes 53 seconds  Estimated Blood Loss:  Estimated blood loss: none.      [facility]

## 2020-08-31 NOTE — Anesthesia Postprocedure Evaluation (Signed)
Anesthesia Post Note  Patient: Victoria Li  Procedure(s) Performed: COLONOSCOPY WITH PROPOFOL (N/A )  Patient location during evaluation: PACU Anesthesia Type: General Level of consciousness: awake and alert Pain management: pain level controlled Vital Signs Assessment: post-procedure vital signs reviewed and stable Respiratory status: spontaneous breathing and respiratory function stable Cardiovascular status: stable Anesthetic complications: no   No complications documented.   Last Vitals:  Vitals:   08/31/20 0859 08/31/20 0909  BP: 103/65 108/66  Pulse: 76 71  Resp: 13 10  Temp:    SpO2: 94% 96%    Last Pain:  Vitals:   08/31/20 0909  TempSrc:   PainSc: 0-No pain                 Rilda Bulls K

## 2020-08-31 NOTE — Anesthesia Preprocedure Evaluation (Signed)
Anesthesia Evaluation  Patient identified by MRN, date of birth, ID band Patient awake    Reviewed: Allergy & Precautions, NPO status , Patient's Chart, lab work & pertinent test results  History of Anesthesia Complications Negative for: history of anesthetic complications  Airway Mallampati: III       Dental   Pulmonary neg sleep apnea, neg COPD, Current Smoker,           Cardiovascular (-) hypertension(-) Past MI and (-) CHF (-) dysrhythmias (-) Valvular Problems/Murmurs     Neuro/Psych neg Seizures    GI/Hepatic Neg liver ROS, GERD  Medicated and Controlled,  Endo/Other  neg diabetes  Renal/GU Renal disease (stones)     Musculoskeletal   Abdominal   Peds  Hematology   Anesthesia Other Findings   Reproductive/Obstetrics                             Anesthesia Physical Anesthesia Plan  ASA: III  Anesthesia Plan: General   Post-op Pain Management:    Induction: Intravenous  PONV Risk Score and Plan: 2 and Propofol infusion and TIVA  Airway Management Planned: Nasal Cannula  Additional Equipment:   Intra-op Plan:   Post-operative Plan:   Informed Consent: I have reviewed the patients History and Physical, chart, labs and discussed the procedure including the risks, benefits and alternatives for the proposed anesthesia with the patient or authorized representative who has indicated his/her understanding and acceptance.       Plan Discussed with:   Anesthesia Plan Comments:         Anesthesia Quick Evaluation

## 2020-08-31 NOTE — Transfer of Care (Signed)
Immediate Anesthesia Transfer of Care Note  Patient: Victoria Li  Procedure(s) Performed: COLONOSCOPY WITH PROPOFOL (N/A )  Patient Location: PACU  Anesthesia Type:MAC  Level of Consciousness: awake and drowsy  Airway & Oxygen Therapy: Patient Spontanous Breathing and Patient connected to nasal cannula oxygen  Post-op Assessment: Report given to RN and Post -op Vital signs reviewed and stable  Post vital signs: stable  Last Vitals:  Vitals Value Taken Time  BP    Temp    Pulse 91 08/31/20 0840  Resp 19 08/31/20 0840  SpO2 94 % 08/31/20 0840  Vitals shown include unvalidated device data.  Last Pain:  Vitals:   08/31/20 0839  TempSrc: (P) Temporal  PainSc:          Complications: No complications documented.

## 2020-09-02 ENCOUNTER — Encounter: Payer: Self-pay | Admitting: General Surgery

## 2020-09-04 LAB — SURGICAL PATHOLOGY

## 2020-09-07 DIAGNOSIS — M170 Bilateral primary osteoarthritis of knee: Secondary | ICD-10-CM | POA: Diagnosis not present

## 2020-09-26 DIAGNOSIS — R102 Pelvic and perineal pain: Secondary | ICD-10-CM | POA: Diagnosis not present

## 2020-09-26 DIAGNOSIS — N882 Stricture and stenosis of cervix uteri: Secondary | ICD-10-CM | POA: Diagnosis not present

## 2020-09-26 DIAGNOSIS — N950 Postmenopausal bleeding: Secondary | ICD-10-CM | POA: Diagnosis not present

## 2020-10-25 DIAGNOSIS — N950 Postmenopausal bleeding: Secondary | ICD-10-CM | POA: Diagnosis not present

## 2020-11-07 DIAGNOSIS — N882 Stricture and stenosis of cervix uteri: Secondary | ICD-10-CM | POA: Diagnosis not present

## 2020-11-07 DIAGNOSIS — R9389 Abnormal findings on diagnostic imaging of other specified body structures: Secondary | ICD-10-CM | POA: Diagnosis not present

## 2020-11-07 DIAGNOSIS — N950 Postmenopausal bleeding: Secondary | ICD-10-CM | POA: Diagnosis not present

## 2020-11-08 DIAGNOSIS — M1712 Unilateral primary osteoarthritis, left knee: Secondary | ICD-10-CM | POA: Diagnosis not present

## 2020-11-08 DIAGNOSIS — M1711 Unilateral primary osteoarthritis, right knee: Secondary | ICD-10-CM | POA: Diagnosis not present

## 2020-11-11 DIAGNOSIS — M1711 Unilateral primary osteoarthritis, right knee: Secondary | ICD-10-CM | POA: Insufficient documentation

## 2020-11-11 DIAGNOSIS — M1712 Unilateral primary osteoarthritis, left knee: Secondary | ICD-10-CM | POA: Insufficient documentation

## 2020-11-13 NOTE — H&P (Signed)
Ms. Victoria Li is a 74 y.o. female here for Pre Op Consulting  History of Present Illness: Patient presents for a preoperative visit to schedule a D&C, hysteroscopy. Surgical indications: postmenopausal bleeding, thickened endometrial stripe, scant endometrial sampling, difficult EMBx, cervical stenosis.   Patient has had multiple instances of vaginal spotting this year, after an LMP of 20 years ago. She began to suspect kidney stones since her sister suggested this possibility to her. Upon clinical pelvic exam, blood was noted in the vaginal vault and at the ectocervical os. See work up so far below.  Workup has included: EMBx 08/2020: Rare endometrial fragments are seen in a specimen that consists mainly of mucus.  TVUS 06/2020: Ut=6.75 x 4.07 x 5.82 cm Post fibroid= 22 mm Endometrium=7.06 mm bil ovs wnl  Pertinent Hx: -Ehlers-Danlos syndrome -NSVD x 2 - first was son [id number]; wanted adoption after the first and came so close but pt got pregnant again and then held off on adoption -Intermittent pelvic cramps, like menstrual, pt thinks maybe anxiety -Fhx of breast cancer in maternal aunt, colon cancer in mother  -Last pap smear 6/20221 neg/neg  Past Medical History:  has a past medical history of Chicken pox, Ehlers-Danlos syndrome, Glaucoma (increased eye pressure), Hammer toe, and Multiple lung nodules on CT.  Past Surgical History:  has a past surgical history that includes SCAR TISSUE REMOVED MULTIPLE TIMES; TOES REMOVED (Bilateral, 2014); BREAST MASS REMOVED (Left, 2002); Colonoscopy (04/18/2015); and Colonoscopy (08/31/2020). Family History: family history includes Breast cancer in her maternal aunt and paternal aunt; Colon cancer in her mother; Diabetes type II in her father; Ehlers-Danlos syndrome in her father, paternal grandfather, and son; Myocardial Infarction (Heart attack) in her father and mother; Stroke in her father. Social History:  reports that she has quit smoking. Her  smoking use included cigarettes. She has never used smokeless tobacco. She reports current alcohol use. She reports that she does not use drugs. OB/GYN History:          OB History    Gravida  4   Para  2   Term  2   Preterm  0   AB  2   Living  2     SAB      IAB      Ectopic      Molar      Multiple      Live Births          Obstetric Comments  Age at first period 44 Age of first pregnancy 89       Allergies: has No Known Allergies. Medications:  Current Outpatient Medications:  .  brimonidine (ALPHAGAN) 0.2 % ophthalmic solution, 1 drop in both eyes 2 times a day, Disp: , Rfl: 2 .  calcium carbonate-vitamin D3 (CALTRATE 600+D) 600 mg(1,500mg ) -200 unit tablet, Take 1 tablet by mouth once daily., Disp: , Rfl:  .  cholecalciferol, vitamin D3, 25 mcg (1,000 unit) Chew, Take 1 tablet by mouth once daily   , Disp: , Rfl:  .  clobetasoL (TEMOVATE) 0.05 % cream, Apply topically 2 (two) times daily   , Disp: , Rfl:  .  cyanocobalamin (VITAMIN B12) 500 MCG tablet, Take 500 mcg by mouth once daily   , Disp: , Rfl:  .  famotidine (PEPCID) 10 MG tablet, Take 10 mg by mouth nightly as needed   , Disp: , Rfl:  .  folic acid (FOLVITE) 1 MG tablet, Take 1 mg by mouth once daily   ,  Disp: , Rfl:  .  latanoprost (XALATAN) 0.005 % ophthalmic solution, Place 1 drop into both eyes nightly., Disp: , Rfl:  .  MULTIVIT WITH MINERALS/LUTEIN (MULTIVITAMIN 50 PLUS ORAL), Take 1 tablet by mouth once daily., Disp: , Rfl:  .  polyethylene glycol (MIRALAX) powder, One bottle for colonoscopy prep. Use as directed., Disp: 255 g, Rfl: 0 .  timolol maleate (TIMOPTIC) 0.5 % ophthalmic solution, , Disp: , Rfl:   Review of Systems: No SOB, no palpitations or chest pain, no new lower extremity edema, no nausea or vomiting or bowel or bladder complaints. See HPI for gyn specific ROS.    Exam:   LMP 12/30/1963   Constitutional:  General appearance: Well nourished, well developed  female in no acute distress.  Neuro/psych:  Normal mood and affect. No gross motor deficits. Neck:  Supple, normal appearance.  Respiratory:  Normal respiratory effort, no use of accessory muscles Skin:  No visible rashes or external lesions  Impression:   The primary encounter diagnosis was Postmenopausal bleeding. Diagnoses of Endometrial thickening on ultrasound and Cervical stenosis (uterine cervix) were also pertinent to this visit.  Plan:   1. Preoperative visit: D&C hysteroscopy. Consents signed today.  -Risks of surgery were discussed with the patient including but not limited to: bleeding which may require transfusion; infection which may require antibiotics; injury to uterus or surrounding organs; intrauterine scarring which may impair future fertility; need for additional procedures including laparotomy or laparoscopy; and other postoperative/anesthesia complications. Written informed consent was obtained.  She will have a postop visit in 2 weeks to review operative findings and pathology.  Return for Postop check.  Diagnoses and all orders for this visit:  Postmenopausal bleeding  Endometrial thickening on ultrasound  Cervical stenosis (uterine cervix)

## 2020-11-14 ENCOUNTER — Other Ambulatory Visit: Payer: Self-pay | Admitting: Obstetrics and Gynecology

## 2020-11-26 ENCOUNTER — Other Ambulatory Visit: Payer: Self-pay

## 2020-11-26 ENCOUNTER — Encounter
Admission: RE | Admit: 2020-11-26 | Discharge: 2020-11-26 | Disposition: A | Discharge status: Home / Self Care | Payer: PPO | Source: Ambulatory Visit | Attending: Obstetrics and Gynecology | Admitting: Obstetrics and Gynecology

## 2020-11-26 HISTORY — DX: Gastro-esophageal reflux disease without esophagitis: K21.9

## 2020-11-26 NOTE — Patient Instructions (Signed)
Your procedure is scheduled on: November 30, 2020 Friday  Report to the Registration Desk on the 1st floor of the [facility]. To find out your arrival time, please call [phone] between 1PM - 3PM on: November 29, 2020 Thursday   REMEMBER: Instructions that are not followed completely may result in serious medical risk, up to and including death; or upon the discretion of your surgeon and anesthesiologist your surgery may need to be rescheduled.  Do not eat food after midnight the night before surgery.  No gum chewing, lozengers or hard candies.  You may however, drink CLEAR liquids up to 2 hours before you are scheduled to arrive for your surgery. Do not drink anything within 2 hours of your scheduled arrival time.  Clear liquids include: - water  - apple juice without pulp - black coffee or tea (Do NOT add milk or creamers to the coffee or tea) Do NOT drink anything that is not on this list.  Type 1 and Type 2 diabetics should only drink water.  In addition, your doctor has ordered for you to drink the provided  Ensure Pre-Surgery Clear Carbohydrate Drink  Drinking this carbohydrate drink up to two hours before surgery helps to reduce insulin resistance and improve patient outcomes. Please complete drinking 2 hours prior to scheduled arrival time.  TAKE THESE MEDICATIONS THE MORNING OF SURGERY WITH A SIP OF WATER: PEPCID (take one the night before and one on the morning of surgery - helps to prevent nausea after surgery.)  One week prior to surgery: Stop Anti-inflammatories (NSAIDS) such as Advil, Aleve, Ibuprofen, Motrin, Naproxen, Naprosyn and ASPIRIN OR  Aspirin based products such as Excedrin, Goodys Powder, BC Powder.  USE TYLENOL Stop ANY OVER THE COUNTER supplements until after surgery. (However, you may continue taking Vitamin D, Vitamin B, and multivitamin up until the day before surgery.)  No Alcohol for 24 hours before or after surgery.  No Smoking including  e-cigarettes for 24 hours prior to surgery.  No chewable tobacco products for at least 6 hours prior to surgery.  No nicotine patches on the day of surgery.  Do not use any "recreational" drugs for at least a week prior to your surgery.  Please be advised that the combination of cocaine and anesthesia may have negative outcomes, up to and including death. If you test positive for cocaine, your surgery will be cancelled.  On the morning of surgery brush your teeth with toothpaste and water, you may rinse your mouth with mouthwash if you wish. Do not swallow any toothpaste or mouthwash.  Do not wear jewelry, make-up, hairpins, clips or nail polish.  Do not wear lotions, powders, or perfumes.   Do not shave body from the neck down 48 hours prior to surgery just in case you cut yourself which could leave a site for infection.  Also, freshly shaved skin may become irritated if using the CHG soap.  Contact lenses, hearing aids and dentures may not be worn into surgery.  Do not bring valuables to the hospital. [facility] is not responsible for any missing/lost belongings or valuables.   TAKE A SHOWER MORNING OF SURGERY   Notify your doctor if there is any change in your medical condition (cold, fever, infection).  Wear comfortable clothing (specific to your surgery type) to the hospital.  Plan for stool softeners for home use; pain medications have a tendency to cause constipation. You can also help prevent constipation by eating foods high in fiber such as  fruits and vegetables and drinking plenty of fluids as your diet allows.  After surgery, you can help prevent lung complications by doing breathing exercises.  Take deep breaths and cough every 1-2 hours. Your doctor may order a device called an Incentive Spirometer to help you take deep breaths. When coughing or sneezing, hold a pillow firmly against your incision with both hands. This is called "splinting." Doing this helps protect  your incision. It also decreases belly discomfort.  If you are being discharged the day of surgery, you will not be allowed to drive home. You will need a responsible adult (18 years or older) to drive you home and stay with you that night.   Please call the [place] Dept. at [phone] if you have any questions about these instructions.  Visitation Policy:  Patients undergoing a surgery or procedure may have one family member or support person with them as long as that person is not COVID-19 positive or experiencing its symptoms.  That person may remain in the waiting area during the procedure.  Inpatient Visitation Update:   In an effort to ensure the safety of our team members and our patients, we are implementing a change to our visitation policy:  Effective Monday, Aug. 9, at 7 a.m., inpatients will be allowed one support person.  o The support person may change daily.  o The support person must pass our screening, gel in and out, and wear a mask at all times, including in the patient's room.  o Patients must also wear a mask when staff or their support person are in the room.  o Masking is required regardless of vaccination status.  Systemwide, no visitors 17 or younger.

## 2020-11-28 ENCOUNTER — Other Ambulatory Visit: Payer: Self-pay

## 2020-11-28 ENCOUNTER — Encounter
Admission: RE | Admit: 2020-11-28 | Discharge: 2020-11-28 | Disposition: A | Discharge status: Home / Self Care | Payer: PPO | Source: Ambulatory Visit | Attending: Obstetrics and Gynecology | Admitting: Obstetrics and Gynecology

## 2020-11-28 DIAGNOSIS — Z01810 Encounter for preprocedural cardiovascular examination: Secondary | ICD-10-CM | POA: Diagnosis not present

## 2020-11-28 DIAGNOSIS — Z20822 Contact with and (suspected) exposure to covid-19: Secondary | ICD-10-CM | POA: Diagnosis not present

## 2020-11-28 DIAGNOSIS — Z01818 Encounter for other preprocedural examination: Secondary | ICD-10-CM | POA: Insufficient documentation

## 2020-11-28 LAB — CBC
HCT: 43.9 % (ref 36.0–46.0)
Hemoglobin: 14.9 g/dL (ref 12.0–15.0)
MCH: 31.5 pg (ref 26.0–34.0)
MCHC: 33.9 g/dL (ref 30.0–36.0)
MCV: 92.8 fL (ref 80.0–100.0)
Platelets: 323 10*3/uL (ref 150–400)
RBC: 4.73 MIL/uL (ref 3.87–5.11)
RDW: 13.2 % (ref 11.5–15.5)
WBC: 6.2 10*3/uL (ref 4.0–10.5)
nRBC: 0 % (ref 0.0–0.2)

## 2020-11-28 LAB — BASIC METABOLIC PANEL
Anion gap: 10 (ref 5–15)
BUN: 17 mg/dL (ref 8–23)
CO2: 25 mmol/L (ref 22–32)
Calcium: 8.9 mg/dL (ref 8.9–10.3)
Chloride: 104 mmol/L (ref 98–111)
Creatinine, Ser: 0.6 mg/dL (ref 0.44–1.00)
GFR, Estimated: >60 mL/min (ref >60)
Glucose, Bld: 86 mg/dL (ref 70–99)
Potassium: 4.3 mmol/L (ref 3.5–5.1)
Sodium: 139 mmol/L (ref 135–145)

## 2020-11-28 LAB — SARS CORONAVIRUS 2 (TAT 6-24 HRS): SARS Coronavirus 2: NEGATIVE

## 2020-11-29 MED ORDER — LACTATED RINGERS IV SOLN: INTRAVENOUS | Status: DC

## 2020-11-29 MED ORDER — CHLORHEXIDINE GLUCONATE 0.12 % MT SOLN: 15.0000 mL | Freq: Once | OROMUCOSAL | Status: DC

## 2020-11-29 MED ORDER — ORAL CARE MOUTH RINSE: 15.0000 mL | Freq: Once | OROMUCOSAL | Status: DC

## 2020-11-30 ENCOUNTER — Ambulatory Visit
Admission: RE | Admit: 2020-11-30 | Discharge: 2020-11-30 | Disposition: A | Discharge status: Home / Self Care | Payer: PPO | Attending: Obstetrics and Gynecology | Admitting: Obstetrics and Gynecology

## 2020-11-30 ENCOUNTER — Ambulatory Visit: Payer: PPO | Admitting: Anesthesiology

## 2020-11-30 ENCOUNTER — Other Ambulatory Visit: Payer: Self-pay

## 2020-11-30 ENCOUNTER — Encounter: Payer: Self-pay | Admitting: Obstetrics and Gynecology

## 2020-11-30 ENCOUNTER — Encounter
Admission: RE | Disposition: A | Discharge status: Home / Self Care | Payer: Self-pay | Source: Home / Self Care | Attending: Obstetrics and Gynecology

## 2020-11-30 DIAGNOSIS — N950 Postmenopausal bleeding: Secondary | ICD-10-CM | POA: Insufficient documentation

## 2020-11-30 DIAGNOSIS — Z87891 Personal history of nicotine dependence: Secondary | ICD-10-CM | POA: Insufficient documentation

## 2020-11-30 DIAGNOSIS — N879 Dysplasia of cervix uteri, unspecified: Secondary | ICD-10-CM | POA: Diagnosis not present

## 2020-11-30 DIAGNOSIS — Z79899 Other long term (current) drug therapy: Secondary | ICD-10-CM | POA: Insufficient documentation

## 2020-11-30 DIAGNOSIS — Q796 Ehlers-Danlos syndrome, unspecified: Secondary | ICD-10-CM | POA: Insufficient documentation

## 2020-11-30 DIAGNOSIS — K219 Gastro-esophageal reflux disease without esophagitis: Secondary | ICD-10-CM | POA: Diagnosis not present

## 2020-11-30 HISTORY — PX: HYSTEROSCOPY WITH D & C: SHX1775

## 2020-11-30 LAB — TYPE AND SCREEN
ABO/RH(D): A POS
Antibody Screen: NEGATIVE

## 2020-11-30 LAB — ABO/RH: ABO/RH(D): A POS

## 2020-11-30 SURGERY — DILATATION AND CURETTAGE /HYSTEROSCOPY
Anesthesia: General

## 2020-11-30 MED ORDER — PROPOFOL 10 MG/ML IV BOLUS
INTRAVENOUS | Status: DC | PRN
  Administered 2020-11-30: 20 mg via INTRAVENOUS
  Administered 2020-11-30: 120 mg via INTRAVENOUS

## 2020-11-30 MED ORDER — OXYCODONE HCL 5 MG/5ML PO SOLN: 5.0000 mg | Freq: Once | ORAL | Status: DC | PRN

## 2020-11-30 MED ORDER — OXYCODONE HCL 5 MG PO TABS: 5.0000 mg | ORAL_TABLET | Freq: Once | ORAL | Status: DC | PRN

## 2020-11-30 MED ORDER — PHENYLEPHRINE HCL (PRESSORS) 10 MG/ML IV SOLN
INTRAVENOUS | Status: DC | PRN
  Administered 2020-11-30 (×2): 100 ug via INTRAVENOUS

## 2020-11-30 MED ORDER — PROPOFOL 10 MG/ML IV BOLUS
INTRAVENOUS | Status: AC
  Filled 2020-11-30: qty 20

## 2020-11-30 MED ORDER — FENTANYL CITRATE (PF) 100 MCG/2ML IJ SOLN
INTRAMUSCULAR | Status: AC
  Filled 2020-11-30: qty 2

## 2020-11-30 MED ORDER — DEXAMETHASONE SODIUM PHOSPHATE 10 MG/ML IJ SOLN
INTRAMUSCULAR | Status: DC | PRN
  Administered 2020-11-30: 10 mg via INTRAVENOUS

## 2020-11-30 MED ORDER — MEPERIDINE HCL 50 MG/ML IJ SOLN: 6.2500 mg | INTRAMUSCULAR | Status: DC | PRN

## 2020-11-30 MED ORDER — HYDROMORPHONE HCL 1 MG/ML IJ SOLN: 0.2500 mg | INTRAMUSCULAR | Status: DC | PRN

## 2020-11-30 MED ORDER — PROMETHAZINE HCL 25 MG/ML IJ SOLN: 6.2500 mg | INTRAMUSCULAR | Status: DC | PRN

## 2020-11-30 MED ORDER — LORAZEPAM 2 MG/ML IJ SOLN: 1.0000 mg | Freq: Once | INTRAMUSCULAR | Status: DC | PRN

## 2020-11-30 MED ORDER — ONDANSETRON HCL 4 MG/2ML IJ SOLN
INTRAMUSCULAR | Status: DC | PRN
  Administered 2020-11-30: 4 mg via INTRAVENOUS

## 2020-11-30 MED ORDER — LIDOCAINE HCL (CARDIAC) PF 100 MG/5ML IV SOSY
PREFILLED_SYRINGE | INTRAVENOUS | Status: DC | PRN
  Administered 2020-11-30: 60 mg via INTRAVENOUS

## 2020-11-30 MED ORDER — DROPERIDOL 2.5 MG/ML IJ SOLN
0.6250 mg | Freq: Once | INTRAMUSCULAR | Status: DC | PRN
  Filled 2020-11-30: qty 2

## 2020-11-30 MED ORDER — SILVER NITRATE-POT NITRATE 75-25 % EX MISC
CUTANEOUS | Status: AC
  Filled 2020-11-30: qty 10

## 2020-11-30 MED ORDER — FENTANYL CITRATE (PF) 100 MCG/2ML IJ SOLN
INTRAMUSCULAR | Status: DC | PRN
  Administered 2020-11-30 (×2): 25 ug via INTRAVENOUS
  Administered 2020-11-30: 50 ug via INTRAVENOUS

## 2020-11-30 SURGICAL SUPPLY — 20 items
BAG INFUSER PRESSURE 100CC (MISCELLANEOUS) ×2 IMPLANT
CANISTER SUCT 3000ML PPV (MISCELLANEOUS) ×2 IMPLANT
CATH ROBINSON RED A/P 16FR (CATHETERS) ×2 IMPLANT
COVER WAND RF STERILE (DRAPES) ×2 IMPLANT
ELECT REM PT RETURN 9FT ADLT (ELECTROSURGICAL) ×2
ELECTRODE REM PT RTRN 9FT ADLT (ELECTROSURGICAL) ×1 IMPLANT
GAUZE 4X4 16PLY RFD (DISPOSABLE) ×2 IMPLANT
GLOVE BIO SURGEON STRL SZ7 (GLOVE) ×2 IMPLANT
GLOVE INDICATOR 7.5 STRL GRN (GLOVE) ×2 IMPLANT
GOWN STRL REUS W/ TWL LRG LVL3 (GOWN DISPOSABLE) ×2 IMPLANT
GOWN STRL REUS W/TWL LRG LVL3 (GOWN DISPOSABLE) ×2
KIT PROCEDURE FLUENT (KITS) ×2 IMPLANT
KIT TURNOVER CYSTO (KITS) ×2 IMPLANT
MANIFOLD NEPTUNE II (INSTRUMENTS) ×2 IMPLANT
PACK DNC HYST (MISCELLANEOUS) ×2 IMPLANT
PAD OB MATERNITY 4.3X12.25 (PERSONAL CARE ITEMS) ×2 IMPLANT
PAD PREP 24X41 OB/GYN DISP (PERSONAL CARE ITEMS) ×2 IMPLANT
SOL .9 NS 3000ML IRR  AL (IV SOLUTION) ×1
SOL .9 NS 3000ML IRR UROMATIC (IV SOLUTION) ×1 IMPLANT
TUBING CONNECTING 10 (TUBING) ×2 IMPLANT

## 2020-11-30 NOTE — Anesthesia Postprocedure Evaluation (Signed)
Anesthesia Post Note  Patient: Victoria Li  Procedure(s) Performed: DILATATION AND CURETTAGE /HYSTEROSCOPY (N/A )  Patient location during evaluation: PACU Anesthesia Type: General Level of consciousness: awake and awake and alert Pain management: pain level controlled Vital Signs Assessment: post-procedure vital signs reviewed and stable Respiratory status: spontaneous breathing Postop Assessment: no apparent nausea or vomiting Anesthetic complications: no   No complications documented.   Last Vitals:  Vitals:   11/30/20 0815 11/30/20 0830  BP: 136/78 132/83  Pulse: 80 75  Resp: 12 13  Temp: 36.7 C   SpO2: 100% 94%    Last Pain:  Vitals:   11/30/20 0830  TempSrc:   PainSc: 0-No pain                 Neva Seat

## 2020-11-30 NOTE — Addendum Note (Signed)
Addendum  created 11/30/20 0935 by Nelda Marseille, CRNA   Charge Capture section accepted

## 2020-11-30 NOTE — Interval H&P Note (Signed)
History and Physical Interval Note:  11/30/2020 7:20 AM  Victoria Li  has presented today for surgery, with the diagnosis of post menopausal bleeding.  The various methods of treatment have been discussed with the patient and family. After consideration of risks, benefits and other options for treatment, the patient has consented to  Procedure(s): DILATATION AND CURETTAGE /HYSTEROSCOPY (N/A) as a surgical intervention.  The patient's history has been reviewed, patient examined, no change in status, stable for surgery.  I have reviewed the patient's chart and labs.  Questions were answered to the patient's satisfaction.     Benjaman Kindler

## 2020-11-30 NOTE — Op Note (Addendum)
Operative Report Hysteroscopy with Dilation and Curettage   Indications: Postmenopausal bleeding   Pre-operative Diagnosis: Thickened endometrium, inability to sample in office   Post-operative Diagnosis: same.  Procedure: 1. Exam under anesthesia 2. Fractional D&C 3. Hysteroscopy  Surgeon: Benjaman Kindler, MD  Assistant(s):  None  Anesthesia: General LMA anesthesia  Anesthesiologist: No responsible provider has been recorded for the case. Anesthesiologist: Neva Seat, DO CRNA: Nelda Marseille, CRNA  Estimated Blood Loss:  Minimal         Total IV Fluids: 430ml  Urine Output: 96ml  Total Fluid Deficit:  -75 mL          Specimens: Endocervical curettings, endometrial curettings         Complications:  None; patient tolerated the procedure well.         Disposition: PACU - hemodynamically stable.         Condition: stable  Findings: Uterus measuring 7 cm by sound; normal cervix, vagina, perineum. Mildly proliferative endometrium without polyps or fibroids noted. No evidence of malignancy grossly.  Indication for procedure/Consents: 74 y.o. F here for scheduled surgery for the aforementioned diagnoses.   Risks of surgery were discussed with the patient including but not limited to: bleeding which may require transfusion; infection which may require antibiotics; injury to uterus or surrounding organs; intrauterine scarring which may impair future fertility; need for additional procedures including laparotomy or laparoscopy; and other postoperative/anesthesia complications. Written informed consent was obtained.    Procedure Details:  Fractional D&C only  The patient was taken to the operating room where anesthesia was administered and was found to be adequate. After a formal and adequate timeout was performed, she was placed in the dorsal lithotomy position and examined with the above findings. She was then prepped and draped in the sterile manner. Her bladder was  catheterized for an estimated amount of clear, yellow urine. A bivalve speculum was then placed in the patient's vagina and a single tooth tenaculum was applied to the anterior lip of the cervix.  Her cervix was serially dilated to 15 [place] using Hanks dilators. An ECC was performed. The hysteroscope was introduced to reveal the above findings. A sharp curettage was then performed until there was a gritty texture in all four quadrants. The tenaculum was removed from the anterior lip of the cervix and the vaginal speculum was removed after applying silver nitrate for good hemostasis.   The patient tolerated the procedure well and was taken to the recovery area awake and in stable condition. She received iv acetaminophen and Toradol prior to leaving the OR.  The patient will be discharged to home as per PACU criteria. Routine postoperative instructions given. She was prescribed Ibuprofen and Colace. She will follow up in the clinic in two weeks for postoperative evaluation.

## 2020-11-30 NOTE — Anesthesia Preprocedure Evaluation (Signed)
Anesthesia Evaluation  Patient identified by MRN, date of birth, ID band Patient awake    Reviewed: Allergy & Precautions, NPO status , Patient's Chart, lab work & pertinent test results  Airway Mallampati: III       Dental no notable dental hx. (+) Teeth Intact   Pulmonary neg pulmonary ROS,    Pulmonary exam normal breath sounds clear to auscultation       Cardiovascular negative cardio ROS Normal cardiovascular exam Rhythm:Regular Rate:Normal     Neuro/Psych negative neurological ROS  negative psych ROS   GI/Hepatic Neg liver ROS, GERD  Medicated and Controlled,  Endo/Other  negative endocrine ROS  Renal/GU negative Renal ROS  negative genitourinary   Musculoskeletal negative musculoskeletal ROS (+)   Abdominal   Peds negative pediatric ROS (+)  Hematology negative hematology ROS (+)   Anesthesia Other Findings Past Medical History: No date: Bruises easily No date: Chicken pox No date: Ehlers-Danlos syndrome No date: GERD (gastroesophageal reflux disease) No date: Glaucoma No date: Hammer toe No date: Multiple lung nodules on CT No date: Swelling   Reproductive/Obstetrics negative OB ROS                             Anesthesia Physical Anesthesia Plan  ASA: III  Anesthesia Plan: General   Post-op Pain Management:    Induction: Intravenous  PONV Risk Score and Plan: 3 and Ondansetron and Dexamethasone  Airway Management Planned: LMA  Additional Equipment:   Intra-op Plan:   Post-operative Plan: Extubation in OR  Informed Consent: I have reviewed the patients History and Physical, chart, labs and discussed the procedure including the risks, benefits and alternatives for the proposed anesthesia with the patient or authorized representative who has indicated his/her understanding and acceptance.       Plan Discussed with: CRNA, Anesthesiologist and  Surgeon  Anesthesia Plan Comments:         Anesthesia Quick Evaluation

## 2020-11-30 NOTE — Anesthesia Procedure Notes (Signed)
Procedure Name: LMA Insertion Date/Time: 11/30/2020 7:45 AM Performed by: Nelda Marseille, CRNA Pre-anesthesia Checklist: Patient identified, Patient being monitored, Timeout performed, Emergency Drugs available and Suction available Patient Re-evaluated:Patient Re-evaluated prior to induction Oxygen Delivery Method: Circle system utilized Preoxygenation: Pre-oxygenation with 100% oxygen Induction Type: IV induction Ventilation: Mask ventilation without difficulty LMA: LMA inserted LMA Size: 4.0 Tube type: Oral Number of attempts: 1 Placement Confirmation: positive ETCO2 and breath sounds checked- equal and bilateral Tube secured with: Tape Dental Injury: Teeth and Oropharynx as per pre-operative assessment

## 2020-11-30 NOTE — Discharge Instructions (Addendum)
Discharge instructions after a hysteroscopy with dilation and curettage  Signs and Symptoms to Report  Call our office at [phone] if you have any of the following:   . Fever over 100.4 degrees or higher . Severe stomach pain not relieved with pain medications . Bright red bleeding that's heavier than a period that does not slow with rest after the first 24 hours . To go the bathroom a lot (frequency), you can't hold your urine (urgency), or it hurts when you empty your bladder (urinate) . Chest pain . Shortness of breath . Pain in the calves of your legs . Severe nausea and vomiting not relieved with anti-nausea medications . Any concerns  What You Can Expect after Surgery . You may see some pink tinged, bloody fluid. This is normal. You may also have cramping for several days.   Activities after Your Discharge Follow these guidelines to help speed your recovery at home: . Don't drive if you are in pain or taking narcotic pain medicine. You may drive when you can safely slam on the brakes, turn the wheel forcefully, and rotate your torso comfortably. This is typically 4-7 days. Practice in a parking lot or side street prior to attempting to drive regularly.  . Ask others to help with household chores for 4 weeks. . Don't do strenuous activities, exercises, or sports like vacuuming, tennis, squash, etc. until your doctor says it is safe to do so. . Walk as you feel able. Rest often since it may take a week or two for your energy level to return to normal.  . You may climb stairs . Avoid constipation:   -Eat fruits, vegetables, and whole grains. Eat small meals as your appetite will take time to return to normal.   -Drink 6 to 8 glasses of water each day unless your doctor has told you to limit your fluids.   -Use a laxative or stool softener as needed if constipation becomes a problem. You may take Miralax, metamucil, Citrucil, Colace, Senekot, FiberCon, etc. If this does not  relieve the constipation, try two tablespoons of Milk Of Magnesia every 8 hours until your bowels move.  . You may shower.  . Do not get in a hot tub, swimming pool, etc. until your doctor agrees. . Do not douche, use tampons, or have sex until your doctor says it is okay, usually about 2 weeks. . Take your pain medicine when you need it. The medicine may not work as well if the pain is bad.  Take the medicines you were taking before surgery. Other medications you might need are pain medications (ibuprofen), medications for constipation (Colace) and nausea medications (Zofran).        AMBULATORY SURGERY  DISCHARGE INSTRUCTIONS   1) The drugs that you were given will stay in your system until tomorrow so for the next 24 hours you should not:  A) Drive an automobile B) Make any legal decisions C) Drink any alcoholic beverage   2) You may resume regular meals tomorrow.  Today it is better to start with liquids and gradually work up to solid foods.  You may eat anything you prefer, but it is better to start with liquids, then soup and crackers, and gradually work up to solid foods.   3) Please notify your doctor immediately if you have any unusual bleeding, trouble breathing, redness and pain at the surgery site, drainage, fever, or pain not relieved by medication.    4) Additional Instructions:          Please contact your physician with any problems or Same Day Surgery at [phone], Monday through Friday 6 am to 4 pm, or [place] [facility] number at [phone]. 

## 2020-11-30 NOTE — Transfer of Care (Signed)
Immediate Anesthesia Transfer of Care Note  Patient: Victoria Li  Procedure(s) Performed: DILATATION AND CURETTAGE /HYSTEROSCOPY (N/A )  Patient Location: PACU  Anesthesia Type:General  Level of Consciousness: awake, alert  and oriented  Airway & Oxygen Therapy: Patient Spontanous Breathing and Patient connected to face mask oxygen  Post-op Assessment: Report given to RN and Post -op Vital signs reviewed and stable  Post vital signs: Reviewed and stable  Last Vitals:  Vitals Value Taken Time  BP 136/78 11/30/20 0815  Temp    Pulse 75 11/30/20 0817  Resp 14 11/30/20 0817  SpO2 100 % 11/30/20 0817  Vitals shown include unvalidated device data.  Last Pain:  Vitals:   11/30/20 0609  TempSrc: Oral  PainSc: 0-No pain         Complications: No complications documented.

## 2020-12-03 LAB — SURGICAL PATHOLOGY

## 2021-01-07 DIAGNOSIS — H401132 Primary open-angle glaucoma, bilateral, moderate stage: Secondary | ICD-10-CM | POA: Diagnosis not present

## 2021-01-12 ENCOUNTER — Other Ambulatory Visit: Payer: PPO

## 2021-01-12 DIAGNOSIS — Z20822 Contact with and (suspected) exposure to covid-19: Secondary | ICD-10-CM | POA: Diagnosis not present

## 2021-01-15 LAB — NOVEL CORONAVIRUS, NAA: SARS-CoV-2, NAA: NOT DETECTED

## 2021-02-04 ENCOUNTER — Inpatient Hospital Stay: Admission: RE | Admit: 2021-02-04 | Payer: PPO | Source: Ambulatory Visit

## 2021-02-04 ENCOUNTER — Other Ambulatory Visit: Payer: PPO

## 2021-02-07 ENCOUNTER — Other Ambulatory Visit: Payer: PPO

## 2021-02-11 ENCOUNTER — Inpatient Hospital Stay: Admit: 2021-02-11 | Payer: PPO | Admitting: Orthopedic Surgery

## 2021-02-11 SURGERY — ARTHROPLASTY, KNEE, TOTAL, USING IMAGELESS COMPUTER-ASSISTED NAVIGATION
Anesthesia: Choice | Site: Knee | Laterality: Right

## 2021-02-27 DIAGNOSIS — R102 Pelvic and perineal pain: Secondary | ICD-10-CM | POA: Diagnosis not present

## 2021-02-27 DIAGNOSIS — N23 Unspecified renal colic: Secondary | ICD-10-CM | POA: Diagnosis not present

## 2021-02-27 DIAGNOSIS — M2390 Unspecified internal derangement of unspecified knee: Secondary | ICD-10-CM | POA: Diagnosis not present

## 2021-02-28 ENCOUNTER — Other Ambulatory Visit: Payer: Self-pay | Admitting: Urology

## 2021-02-28 DIAGNOSIS — N200 Calculus of kidney: Secondary | ICD-10-CM

## 2021-02-28 DIAGNOSIS — R102 Pelvic and perineal pain: Secondary | ICD-10-CM

## 2021-03-13 ENCOUNTER — Ambulatory Visit
Admission: RE | Admit: 2021-03-13 | Discharge: 2021-03-13 | Disposition: A | Discharge status: Home / Self Care | Payer: PPO | Source: Ambulatory Visit | Attending: Urology | Admitting: Urology

## 2021-03-13 ENCOUNTER — Other Ambulatory Visit: Payer: Self-pay

## 2021-03-13 DIAGNOSIS — K5730 Diverticulosis of large intestine without perforation or abscess without bleeding: Secondary | ICD-10-CM | POA: Diagnosis not present

## 2021-03-13 DIAGNOSIS — R102 Pelvic and perineal pain: Secondary | ICD-10-CM | POA: Diagnosis not present

## 2021-03-13 DIAGNOSIS — N200 Calculus of kidney: Secondary | ICD-10-CM | POA: Diagnosis not present

## 2021-03-13 DIAGNOSIS — Z87442 Personal history of urinary calculi: Secondary | ICD-10-CM | POA: Diagnosis not present

## 2021-03-13 DIAGNOSIS — N3289 Other specified disorders of bladder: Secondary | ICD-10-CM | POA: Diagnosis not present

## 2021-03-13 DIAGNOSIS — K449 Diaphragmatic hernia without obstruction or gangrene: Secondary | ICD-10-CM | POA: Diagnosis not present

## 2021-03-13 LAB — POCT I-STAT CREATININE: Creatinine, Ser: 0.7 mg/dL (ref 0.44–1.00)

## 2021-03-13 MED ORDER — IOHEXOL 300 MG/ML  SOLN
100.0000 mL | Freq: Once | INTRAMUSCULAR | Status: AC | PRN
  Administered 2021-03-13: 100 mL via INTRAVENOUS

## 2021-03-16 NOTE — Discharge Instructions (Signed)
Instructions after Total Knee Replacement   Victoria Li, Jr., M.D.     Dept. of Orthopaedics & Sports Medicine  [facility]  [address]  [place], [place]  [address]  Phone: [phone]   Fax: [phone]    DIET: Drink plenty of non-alcoholic fluids. Resume your normal diet. Include foods high in fiber.  ACTIVITY:  You may use crutches or a walker with weight-bearing as tolerated, unless instructed otherwise. You may be weaned off of the walker or crutches by your Physical Therapist.  Do NOT place pillows under the knee. Anything placed under the knee could limit your ability to straighten the knee.   Continue doing gentle exercises. Exercising will reduce the pain and swelling, increase motion, and prevent muscle weakness.   Please continue to use the TED compression stockings for 6 weeks. You may remove the stockings at night, but should reapply them in the morning. Do not drive or operate any equipment until instructed.  WOUND CARE:  Continue to use the PolarCare or ice packs periodically to reduce pain and swelling. You may bathe or shower after the staples are removed at the first office visit following surgery.  MEDICATIONS: You may resume your regular medications. Please take the pain medication as prescribed on the medication. Do not take pain medication on an empty stomach. You have been given a prescription for a blood thinner (Lovenox or Coumadin). Please take the medication as instructed. (NOTE: After completing a 2 week course of Lovenox, take one Enteric-coated aspirin once a day. This along with elevation will help reduce the possibility of phlebitis in your operated leg.) Do not drive or drink alcoholic beverages when taking pain medications.  CALL THE OFFICE FOR: Temperature above 101 degrees Excessive bleeding or drainage on the dressing. Excessive swelling, coldness, or paleness of the toes. Persistent nausea and vomiting.  FOLLOW-UP:  You  should have an appointment to return to the office in 10-14 days after surgery. Arrangements have been made for continuation of Physical Therapy (either home therapy or outpatient therapy).   [facility] Department Directory         [email or web address]       [email or web address]          Cardiology  Appointments: [place] - [phone] [place] - [phone]  Endocrinology  Appointments: [place] - [phone] [place] - [phone]  Gastroenterology  Appointments: [place] - [phone] [place] - [phone]        General Surgery   Appointments: [place] - [phone]  Internal Medicine/Family Medicine  Appointments: [place] - [phone] [place] - [phone] [place] - [phone]  Metabolic and Weigh Loss Surgery  Appointments: [place] - [phone]        Neurology  Appointments: [place] - [phone] [place] - [phone]  Neurosurgery  Appointments: [place] - [phone]  Obstetrics & Gynecology  Appointments: [place] - [phone] [place] - [phone]        Pediatrics  Appointments: [place] - [phone] [place] - [phone]  Physiatry  Appointments: [place] -[phone]  Physical Therapy  Appointments: [place] - [phone] [place] - [phone]        Podiatry  Appointments: [place] - [phone] [place] - [phone]  Pulmonology  Appointments: [place] - [phone]  Rheumatology  Appointments: [place] - [phone]        [place] Location: [facility]  [address] [place], [place]  [address]  [place] Location: [facility] [address] [place], [place]  [address]  [place] Location: [facility] [address] [place], [place]  [address]    

## 2021-03-18 DIAGNOSIS — N23 Unspecified renal colic: Secondary | ICD-10-CM | POA: Diagnosis not present

## 2021-03-18 DIAGNOSIS — R102 Pelvic and perineal pain: Secondary | ICD-10-CM | POA: Diagnosis not present

## 2021-03-19 ENCOUNTER — Telehealth: Payer: Self-pay | Admitting: Family Medicine

## 2021-03-19 DIAGNOSIS — M1711 Unilateral primary osteoarthritis, right knee: Secondary | ICD-10-CM | POA: Diagnosis not present

## 2021-03-19 NOTE — Telephone Encounter (Signed)
Copied from [place] [phone]. Topic: Medicare AWV >> Mar 19, 2021 11:29 AM Cher Nakai R wrote: Reason for CRM:  Left message for patient to call back and schedule Medicare Annual Wellness Visit (AWV) in office.   If not able to come in office, please offer to do virtually or by telephone.   Last AWV: 03/22/2020  Please schedule at anytime with [facility] Advisor.  If any questions, please contact me at [phone]

## 2021-03-20 ENCOUNTER — Other Ambulatory Visit
Admission: RE | Admit: 2021-03-20 | Discharge: 2021-03-20 | Disposition: A | Discharge status: Home / Self Care | Payer: PPO | Source: Ambulatory Visit | Attending: Orthopedic Surgery | Admitting: Orthopedic Surgery

## 2021-03-20 ENCOUNTER — Other Ambulatory Visit: Payer: Self-pay | Admitting: Urgent Care

## 2021-03-20 ENCOUNTER — Other Ambulatory Visit: Payer: Self-pay

## 2021-03-20 DIAGNOSIS — Z01818 Encounter for other preprocedural examination: Secondary | ICD-10-CM | POA: Insufficient documentation

## 2021-03-20 DIAGNOSIS — Z01810 Encounter for preprocedural cardiovascular examination: Secondary | ICD-10-CM | POA: Diagnosis not present

## 2021-03-20 LAB — COMPREHENSIVE METABOLIC PANEL
ALT: 16 U/L (ref 0–44)
AST: 21 U/L (ref 15–41)
Albumin: 3.6 g/dL (ref 3.5–5.0)
Alkaline Phosphatase: 63 U/L (ref 38–126)
Anion gap: 9 (ref 5–15)
BUN: 13 mg/dL (ref 8–23)
CO2: 25 mmol/L (ref 22–32)
Calcium: 8.8 mg/dL — ABNORMAL LOW (ref 8.9–10.3)
Chloride: 106 mmol/L (ref 98–111)
Creatinine, Ser: 0.59 mg/dL (ref 0.44–1.00)
GFR, Estimated: >60 mL/min (ref >60)
Glucose, Bld: 103 mg/dL — ABNORMAL HIGH (ref 70–99)
Potassium: 3.8 mmol/L (ref 3.5–5.1)
Sodium: 140 mmol/L (ref 135–145)
Total Bilirubin: 0.7 mg/dL (ref 0.3–1.2)
Total Protein: 7 g/dL (ref 6.5–8.1)

## 2021-03-20 LAB — TYPE AND SCREEN
ABO/RH(D): A POS
Antibody Screen: NEGATIVE

## 2021-03-20 LAB — CBC
HCT: 43.8 % (ref 36.0–46.0)
Hemoglobin: 14.6 g/dL (ref 12.0–15.0)
MCH: 30.9 pg (ref 26.0–34.0)
MCHC: 33.3 g/dL (ref 30.0–36.0)
MCV: 92.6 fL (ref 80.0–100.0)
Platelets: 303 10*3/uL (ref 150–400)
RBC: 4.73 MIL/uL (ref 3.87–5.11)
RDW: 13.2 % (ref 11.5–15.5)
WBC: 5.4 10*3/uL (ref 4.0–10.5)
nRBC: 0 % (ref 0.0–0.2)

## 2021-03-20 LAB — URINALYSIS, ROUTINE W REFLEX MICROSCOPIC
Bacteria, UA: NONE SEEN
Bilirubin Urine: NEGATIVE
Glucose, UA: NEGATIVE mg/dL
Hgb urine dipstick: NEGATIVE
Ketones, ur: NEGATIVE mg/dL
Nitrite: NEGATIVE
Protein, ur: NEGATIVE mg/dL
Specific Gravity, Urine: 1.019 (ref 1.005–1.030)
pH: 5 (ref 5.0–8.0)

## 2021-03-20 LAB — PROTIME-INR
INR: 0.9 (ref 0.8–1.2)
Prothrombin Time: 11.9 seconds (ref 11.4–15.2)

## 2021-03-20 LAB — C-REACTIVE PROTEIN: CRP: 0.7 mg/dL (ref <1.0)

## 2021-03-20 LAB — SURGICAL PCR SCREEN
MRSA, PCR: NEGATIVE
Staphylococcus aureus: POSITIVE — AB

## 2021-03-20 LAB — SEDIMENTATION RATE: Sed Rate: 23 mm/hr (ref 0–30)

## 2021-03-20 LAB — APTT: aPTT: 31 seconds (ref 24–36)

## 2021-03-20 NOTE — Addendum Note (Signed)
Addended by: Honor Loh on: 03/20/2021 02:51 PM   Modules accepted: Level of Service, SmartSet

## 2021-03-20 NOTE — Patient Instructions (Signed)
Your procedure is scheduled on: Wednesday March 27, 2021. Report to Day Surgery inside [facility] 2nd floor (stop by Admissions desk first before getting on Elevator) To find out your arrival time please call [phone] between 1PM - 3PM on Tuesday March 26, 2021.  Remember: Instructions that are not followed completely may result in serious medical risk,  up to and including death, or upon the discretion of your surgeon and anesthesiologist your  surgery may need to be rescheduled.     _X__ 1. Do not eat food after midnight the night before your procedure.                 No chewing gum or hard candies. You may drink clear liquids up to 2 hours                 before you are scheduled to arrive for your surgery- DO not drink clear                 liquids within 2 hours of the start of your surgery.                 Clear Liquids include:  water, apple juice without pulp, clear Gatorade, G2 or                  Gatorade Zero (avoid Red/Purple/Blue), Black Coffee or Tea (Do not add                 anything to coffee or tea).  __X__2.   Complete the "Ensure Clear Pre-surgery Clear Carbohydrate Drink" provided to you, 2 hours before arrival. **If you are diabetic you will be provided with an alternative drink, Gatorade Zero or G2.  __X__3.  On the morning of surgery brush your teeth with toothpaste and water, you                may rinse your mouth with mouthwash if you wish.  Do not swallow any toothpaste of mouthwash.     _X__ 4.  No Alcohol for 24 hours before or after surgery.   _X__ 5.  Do Not Smoke or use e-cigarettes For 24 Hours Prior to Your Surgery.                 Do not use any chewable tobacco products for at least 6 hours prior to                 Surgery.  _X__  6.  Do not use any recreational drugs (marijuana, cocaine, heroin, ecstasy, MDMA or other)                For at least one week prior to your surgery.  Combination of these drugs with  anesthesia                May have life threatening results.  __X__  7.  Notify your doctor if there is any change in your medical condition      (cold, fever, infections).     Do not wear jewelry, make-up, hairpins, clips or nail polish. Do not wear lotions, powders, or perfumes. You may wear deodorant. Do not shave 48 hours prior to surgery. Men may shave face and neck. Do not bring valuables to the hospital.    [facility] is not responsible for any belongings or valuables.  Contacts, dentures or bridgework may not be worn into surgery. Leave your suitcase in the car. After  surgery it may be brought to your room. For patients admitted to the hospital, discharge time is determined by your treatment team.   Patients discharged the day of surgery will not be allowed to drive home.   Make arrangements for someone to be with you for the first 24 hours of your Same Day Discharge.    Please read over the following fact sheets that you were given:   Total Joint Packet    __X__ Take these medicines the morning of surgery with A SIP OF WATER:    1. famotidine (PEPCID) 20 MG  2.   3.   4.  5.  6.  ____ Fleet Enema (as directed)   __X__ Use CHG Soap (or wipes) as directed  ____ Use Benzoyl Peroxide Gel as instructed  __X__ Use eye drops on the day of surgery  brimonidine (ALPHAGAN) 0.2 % ophthalmic solution  timolol (TIMOPTIC) 0.5 % ophthalmic solution  ____ Stop metformin 2 days prior to surgery    ____ Take 1/2 of usual insulin dose the night before surgery. No insulin the morning          of surgery.   __X__ Stop Anti-inflammatories such as Ibuprofen, Aleve, Advil, naproxen, aspirin, and BC powders.   __X__ Stop supplements until after surgery.    __X__ Do not start any herbal supplements before your procedure.    If you have any questions regarding your pre-procedure instructions,  Please call Pre-admit Testing at [phone].

## 2021-03-21 LAB — URINE CULTURE: Special Requests: NORMAL

## 2021-03-24 NOTE — H&P (Signed)
ORTHOPAEDIC HISTORY & PHYSICAL Gwenlyn Fudge, [place] - 03/19/2021 1:45 PM EDT Formatting of this note is different from the original. [facility] Chief Complaint:   Chief Complaint  Patient presents with  . Knee Pain  H & P RIGHT KNEE   History of Present Illness:   Victoria Li is a 75 y.o. female that presents to clinic today for her preoperative history and evaluation. Patient presents unaccompanied. The patient is scheduled to undergo a right total knee arthroplasty on 03/27/21 by Dr. Marry Guan. Her pain began The pain is located primarily along the lateral aspect of the knee. She describes her pain as worse with standing and walking. She reports associated giving way of the knees. She denies associated numbness or tingling, denies swelling or locking.   The patient's symptoms have progressed to the point that they decrease her quality of life. The patient has previously undergone conservative treatment including NSAIDS and injections to the knee without adequate control of her symptoms.  Of note, patient has a history of Ehlers-Danlos syndrome. Patient does express concern about the staples post-operatively. She states that she has typically kept staples in for 3 weeks following surgery.  Denies history of blood clots, significant cardiac history, or lumbar surgery.  Past Medical, Surgical, Family, Social History, Allergies, Medications:   Past Medical History:  Past Medical History:  Diagnosis Date  . Chicken pox  . Ehlers-Danlos syndrome  . Glaucoma (increased eye pressure)  . Hammer toe  . Multiple lung nodules on CT  . PMB (postmenopausal bleeding)   Past Surgical History:  Past Surgical History:  Procedure Laterality Date  . BREAST MASS REMOVED Left 2002  . COLONOSCOPY 04/18/2015  Hyperplastic sigmoid polyps removed.  . COLONOSCOPY 08/31/2020  Negative Colon Biopsy/FHx CC/Repeat 37yrs/[facility]  . DILATION AND CURETTAGE OF  UTERUS  . HYSTEROSCOPY  . SCAR TISSUE REMOVED MULTIPLE TIMES  . TOES REMOVED Bilateral 2014   Current Medications:  Current Outpatient Medications  Medication Sig Dispense Refill  . brimonidine (ALPHAGAN) 0.2 % ophthalmic solution 1 drop in both eyes 2 times a day 2  . calcium carbonate-vitamin D3 (CALTRATE 600+D) 600 mg(1,500mg ) -200 unit tablet Take 1 tablet by mouth once daily.  . cholecalciferol, vitamin D3, 25 mcg (1,000 unit) Chew Take 1 tablet by mouth once daily  . clobetasoL (TEMOVATE) 0.05 % cream Apply topically 2 (two) times daily  . cyanocobalamin (VITAMIN B12) 500 MCG tablet Take 500 mcg by mouth once daily  . famotidine (PEPCID) 10 MG tablet Take 10 mg by mouth nightly as needed  . folic acid (FOLVITE) 1 MG tablet Take 1 mg by mouth once daily  . latanoprost (XALATAN) 0.005 % ophthalmic solution Place 1 drop into both eyes nightly.  . MULTIVIT WITH MINERALS/LUTEIN (MULTIVITAMIN 50 PLUS ORAL) Take 1 tablet by mouth once daily.  . polyethylene glycol (MIRALAX) powder One bottle for colonoscopy prep. Use as directed. 255 g 0  . timolol maleate (TIMOPTIC) 0.5 % ophthalmic solution   No current facility-administered medications for this visit.   Allergies: No Known Allergies  Social History:  Social History   Socioeconomic History  . Marital status: Married  Spouse name: Marnette Burgess Daffron  . Number of children: 2  . Years of education: 14+  . Highest education level: Not on file  Occupational History  . Occupation: Retired  Tobacco Use  . Smoking status: Never Smoker  . Smokeless tobacco: Never Used  Substance and Sexual Activity  .  Alcohol use: Yes  Comment: occasionally  . Drug use: No  . Sexual activity: Yes  Partners: Male  Birth control/protection: Post-menopausal  Other Topics Concern  . Would you please tell us about the people who live in your home, your pets, or anything else important to your social life? Not Asked  Social History Narrative  . Not  on file   Social Determinants of Health   Financial Resource Strain: Not on file  Food Insecurity: Not on file  Transportation Needs: Not on file  Physical Activity: Not on file  Stress: Not on file  Social Connections: Not on file  Housing Stability: Not on file   Family History:  Family History  Problem Relation Age of Onset  . Myocardial Infarction (Heart attack) Mother  . Colon cancer Mother  . Myocardial Infarction (Heart attack) Father  . Stroke Father  . Diabetes type II Father  . Ehlers-Danlos syndrome Father  . Ehlers-Danlos syndrome Son  . Ehlers-Danlos syndrome Paternal Grandfather  . Breast cancer Maternal Aunt  . Breast cancer Paternal Aunt   Review of Systems:   A 10+ ROS was performed, reviewed, and the pertinent orthopaedic findings are documented in the HPI.   Physical Examination:   Ht 157.5 cm (5\' 2" )  LMP 12/30/1963  BMI 37.13 kg/m   Patient is a well-developed, well-nourished female in no acute distress. Patient has normal mood and affect. Patient is alert and oriented to person, place, and time.   HEENT: Atraumatic, normocephalic. Pupils equal and reactive to light. Extraocular motion intact. Noninjected sclera.  Cardiovascular: Regular rate and rhythm, with no murmurs, rubs, or gallops. Distal pulses palpable.  Respiratory: Lungs clear to auscultation bilaterally.   Left Knee: Soft tissue swelling: none Effusion: none Erythema: none Crepitance: mild Tenderness: lateral Alignment: relative valgus Mediolateral laxity: lateral pseudolaxity Posterior sag: negative Patellar tracking: Good tracking without evidence of subluxation or tilt Atrophy: No significant atrophy.  Quadriceps tone was fair to good. Range of motion: 13/0/130 degrees  Sensation intact over the saphenous, lateral sural cutaneous, superficial fibular, and deep fibular nerve distributions.  Tests Performed/Reviewed:  X-rays  Anteroposterior, lateral, and sunrise views  of the right knee were obtained. Images reveal severe loss of lateral compartment joint space with osteophyte formation. Medial compartment is relatively well-preserved. Mild loss of patellofemoral joint space noted. Significant valgus alignment is noted. No fractures.  I personally ordered and interpreted today's xrays.  Impression:   ICD-10-CM  1. Primary osteoarthritis of right knee M17.11   Plan:   The patient has end-stage degenerative changes of the right knee. It was explained to the patient that the condition is progressive in nature. Having failed conservative treatment, the patient has elected to proceed with a total joint arthroplasty. The patient will undergo a total joint arthroplasty with Dr. Marry Guan. The risks of surgery, including blood clot and infection, were discussed with the patient. Measures to reduce these risks, including the use of anticoagulation, perioperative antibiotics, and early ambulation were discussed. The importance of postoperative physical therapy was discussed with the patient. The patient elects to proceed with surgery. The patient is instructed to stop all blood thinners prior to surgery. The patient is instructed to call the hospital the day before surgery to learn of the proper arrival time.   Contact our office with any questions or concerns. Follow up as indicated, or sooner should any new problems arise, if conditions worsen, or if they are otherwise concerned.   Gwenlyn Fudge, PA-C [facility] [place], [place] [address] Phone: [phone]  This note was generated in part with voice recognition software and I apologize for any typographical errors that were not detected and corrected.   Electronically signed by Gwenlyn Fudge, PA at 03/19/2021 2:51 PM EDT

## 2021-03-25 ENCOUNTER — Other Ambulatory Visit: Payer: Self-pay

## 2021-03-25 ENCOUNTER — Other Ambulatory Visit
Admission: RE | Admit: 2021-03-25 | Discharge: 2021-03-25 | Disposition: A | Discharge status: Home / Self Care | Payer: PPO | Source: Ambulatory Visit | Attending: Orthopedic Surgery | Admitting: Orthopedic Surgery

## 2021-03-25 DIAGNOSIS — M25761 Osteophyte, right knee: Secondary | ICD-10-CM | POA: Diagnosis present

## 2021-03-25 DIAGNOSIS — K219 Gastro-esophageal reflux disease without esophagitis: Secondary | ICD-10-CM | POA: Diagnosis present

## 2021-03-25 DIAGNOSIS — M170 Bilateral primary osteoarthritis of knee: Secondary | ICD-10-CM | POA: Diagnosis not present

## 2021-03-25 DIAGNOSIS — R531 Weakness: Secondary | ICD-10-CM | POA: Diagnosis not present

## 2021-03-25 DIAGNOSIS — Q796 Ehlers-Danlos syndrome, unspecified: Secondary | ICD-10-CM | POA: Diagnosis not present

## 2021-03-25 DIAGNOSIS — Z01812 Encounter for preprocedural laboratory examination: Secondary | ICD-10-CM | POA: Insufficient documentation

## 2021-03-25 DIAGNOSIS — M1711 Unilateral primary osteoarthritis, right knee: Secondary | ICD-10-CM | POA: Diagnosis present

## 2021-03-25 DIAGNOSIS — H409 Unspecified glaucoma: Secondary | ICD-10-CM | POA: Diagnosis present

## 2021-03-25 DIAGNOSIS — Z79899 Other long term (current) drug therapy: Secondary | ICD-10-CM | POA: Diagnosis not present

## 2021-03-25 DIAGNOSIS — H5789 Other specified disorders of eye and adnexa: Secondary | ICD-10-CM | POA: Diagnosis present

## 2021-03-25 DIAGNOSIS — Z471 Aftercare following joint replacement surgery: Secondary | ICD-10-CM | POA: Diagnosis not present

## 2021-03-25 DIAGNOSIS — R918 Other nonspecific abnormal finding of lung field: Secondary | ICD-10-CM | POA: Diagnosis present

## 2021-03-25 DIAGNOSIS — Z96651 Presence of right artificial knee joint: Secondary | ICD-10-CM | POA: Diagnosis not present

## 2021-03-25 DIAGNOSIS — Z20822 Contact with and (suspected) exposure to covid-19: Secondary | ICD-10-CM | POA: Diagnosis present

## 2021-03-25 DIAGNOSIS — R911 Solitary pulmonary nodule: Secondary | ICD-10-CM | POA: Diagnosis not present

## 2021-03-25 DIAGNOSIS — N200 Calculus of kidney: Secondary | ICD-10-CM | POA: Diagnosis not present

## 2021-03-25 LAB — SARS CORONAVIRUS 2 (TAT 6-24 HRS): SARS Coronavirus 2: NEGATIVE

## 2021-03-26 ENCOUNTER — Encounter: Payer: Self-pay | Admitting: Orthopedic Surgery

## 2021-03-26 DIAGNOSIS — R102 Pelvic and perineal pain: Secondary | ICD-10-CM | POA: Insufficient documentation

## 2021-03-26 MED ORDER — ORAL CARE MOUTH RINSE: 15.0000 mL | Freq: Once | OROMUCOSAL | Status: AC

## 2021-03-26 MED ORDER — LACTATED RINGERS IV SOLN: INTRAVENOUS | Status: DC

## 2021-03-26 MED ORDER — CHLORHEXIDINE GLUCONATE 0.12 % MT SOLN: 15.0000 mL | Freq: Once | OROMUCOSAL | Status: AC

## 2021-03-26 MED ORDER — CELECOXIB 200 MG PO CAPS: 400.0000 mg | ORAL_CAPSULE | Freq: Once | ORAL | Status: AC

## 2021-03-27 ENCOUNTER — Inpatient Hospital Stay: Payer: PPO

## 2021-03-27 ENCOUNTER — Encounter: Payer: Self-pay | Admitting: Orthopedic Surgery

## 2021-03-27 ENCOUNTER — Encounter
Admission: RE | Disposition: A | Discharge status: Home / Self Care | Payer: Self-pay | Source: Home / Self Care | Attending: Orthopedic Surgery

## 2021-03-27 ENCOUNTER — Other Ambulatory Visit: Payer: Self-pay

## 2021-03-27 ENCOUNTER — Inpatient Hospital Stay
Admission: RE | Admit: 2021-03-27 | Discharge: 2021-03-28 | DRG: 470 | Disposition: A | Discharge status: Home / Self Care | Payer: PPO | Attending: Orthopedic Surgery | Admitting: Orthopedic Surgery

## 2021-03-27 DIAGNOSIS — Q796 Ehlers-Danlos syndrome, unspecified: Secondary | ICD-10-CM | POA: Diagnosis not present

## 2021-03-27 DIAGNOSIS — Z79899 Other long term (current) drug therapy: Secondary | ICD-10-CM | POA: Diagnosis not present

## 2021-03-27 DIAGNOSIS — H409 Unspecified glaucoma: Secondary | ICD-10-CM | POA: Diagnosis present

## 2021-03-27 DIAGNOSIS — R918 Other nonspecific abnormal finding of lung field: Secondary | ICD-10-CM | POA: Diagnosis present

## 2021-03-27 DIAGNOSIS — K219 Gastro-esophageal reflux disease without esophagitis: Secondary | ICD-10-CM | POA: Diagnosis present

## 2021-03-27 DIAGNOSIS — M25761 Osteophyte, right knee: Secondary | ICD-10-CM | POA: Diagnosis present

## 2021-03-27 DIAGNOSIS — Z20822 Contact with and (suspected) exposure to covid-19: Secondary | ICD-10-CM | POA: Diagnosis present

## 2021-03-27 DIAGNOSIS — Z96651 Presence of right artificial knee joint: Secondary | ICD-10-CM

## 2021-03-27 DIAGNOSIS — M1711 Unilateral primary osteoarthritis, right knee: Secondary | ICD-10-CM | POA: Diagnosis present

## 2021-03-27 DIAGNOSIS — H5789 Other specified disorders of eye and adnexa: Secondary | ICD-10-CM | POA: Diagnosis present

## 2021-03-27 DIAGNOSIS — Z96659 Presence of unspecified artificial knee joint: Secondary | ICD-10-CM

## 2021-03-27 HISTORY — PX: KNEE ARTHROPLASTY: SHX992

## 2021-03-27 SURGERY — ARTHROPLASTY, KNEE, TOTAL, USING IMAGELESS COMPUTER-ASSISTED NAVIGATION
Anesthesia: Spinal | Site: Knee | Laterality: Right

## 2021-03-27 MED ORDER — METOCLOPRAMIDE HCL 10 MG PO TABS
10.0000 mg | ORAL_TABLET | Freq: Three times a day (TID) | ORAL | Status: DC
  Administered 2021-03-27 – 2021-03-28 (×4): 10 mg via ORAL
  Filled 2021-03-27 (×4): qty 1

## 2021-03-27 MED ORDER — BUPIVACAINE HCL (PF) 0.5 % IJ SOLN
INTRAMUSCULAR | Status: AC
  Filled 2021-03-27: qty 10

## 2021-03-27 MED ORDER — TIMOLOL MALEATE 0.5 % OP SOLN
1.0000 [drp] | Freq: Two times a day (BID) | OPHTHALMIC | Status: DC
  Administered 2021-03-27 – 2021-03-28 (×2): 1 [drp] via OPHTHALMIC
  Filled 2021-03-27: qty 5

## 2021-03-27 MED ORDER — FENTANYL CITRATE (PF) 100 MCG/2ML IJ SOLN
25.0000 ug | INTRAMUSCULAR | Status: DC | PRN
  Administered 2021-03-27 (×3): 25 ug via INTRAVENOUS

## 2021-03-27 MED ORDER — CALCIUM-VITAMIN D 500-200 MG-UNIT PO TABS
1.0000 | ORAL_TABLET | Freq: Every day | ORAL | Status: DC
  Administered 2021-03-28: 1 via ORAL
  Filled 2021-03-27: qty 1

## 2021-03-27 MED ORDER — SODIUM CHLORIDE 0.9 % IV SOLN
INTRAVENOUS | Status: DC | PRN
  Administered 2021-03-27: 30 ug/min via INTRAVENOUS

## 2021-03-27 MED ORDER — TRAMADOL HCL 50 MG PO TABS: 50.0000 mg | ORAL_TABLET | ORAL | Status: DC | PRN

## 2021-03-27 MED ORDER — MIDAZOLAM HCL 2 MG/2ML IJ SOLN
INTRAMUSCULAR | Status: AC
  Filled 2021-03-27: qty 2

## 2021-03-27 MED ORDER — FENTANYL CITRATE (PF) 100 MCG/2ML IJ SOLN
INTRAMUSCULAR | Status: DC | PRN
  Administered 2021-03-27: 25 ug via INTRAVENOUS

## 2021-03-27 MED ORDER — PROPOFOL 500 MG/50ML IV EMUL
INTRAVENOUS | Status: DC | PRN
  Administered 2021-03-27: 50 ug/kg/min via INTRAVENOUS

## 2021-03-27 MED ORDER — PANTOPRAZOLE SODIUM 40 MG PO TBEC
40.0000 mg | DELAYED_RELEASE_TABLET | Freq: Two times a day (BID) | ORAL | Status: DC
  Administered 2021-03-27 – 2021-03-28 (×2): 40 mg via ORAL
  Filled 2021-03-27 (×2): qty 1

## 2021-03-27 MED ORDER — FLEET ENEMA 7-19 GM/118ML RE ENEM: 1.0000 | ENEMA | Freq: Once | RECTAL | Status: DC | PRN

## 2021-03-27 MED ORDER — TRANEXAMIC ACID-NACL 1000-0.7 MG/100ML-% IV SOLN
1000.0000 mg | INTRAVENOUS | Status: AC
  Administered 2021-03-27: 1000 mg via INTRAVENOUS

## 2021-03-27 MED ORDER — ACETAMINOPHEN 10 MG/ML IV SOLN
INTRAVENOUS | Status: AC
  Filled 2021-03-27: qty 100

## 2021-03-27 MED ORDER — PROPOFOL 500 MG/50ML IV EMUL
INTRAVENOUS | Status: AC
  Filled 2021-03-27: qty 50

## 2021-03-27 MED ORDER — ALUM & MAG HYDROXIDE-SIMETH 200-200-20 MG/5ML PO SUSP: 30.0000 mL | ORAL | Status: DC | PRN

## 2021-03-27 MED ORDER — BRIMONIDINE TARTRATE 0.2 % OP SOLN
1.0000 [drp] | Freq: Two times a day (BID) | OPHTHALMIC | Status: DC
  Administered 2021-03-27 – 2021-03-28 (×2): 1 [drp] via OPHTHALMIC
  Filled 2021-03-27: qty 5

## 2021-03-27 MED ORDER — FERROUS SULFATE 325 (65 FE) MG PO TABS
325.0000 mg | ORAL_TABLET | Freq: Two times a day (BID) | ORAL | Status: DC
  Administered 2021-03-28 (×2): 325 mg via ORAL
  Filled 2021-03-27 (×2): qty 1

## 2021-03-27 MED ORDER — ADULT MULTIVITAMIN W/MINERALS CH
1.0000 | ORAL_TABLET | Freq: Every day | ORAL | Status: DC
  Administered 2021-03-28: 1 via ORAL
  Filled 2021-03-27: qty 1

## 2021-03-27 MED ORDER — CEFAZOLIN SODIUM-DEXTROSE 2-4 GM/100ML-% IV SOLN
INTRAVENOUS | Status: AC
  Filled 2021-03-27: qty 100

## 2021-03-27 MED ORDER — GABAPENTIN 300 MG PO CAPS: 300.0000 mg | ORAL_CAPSULE | Freq: Once | ORAL | Status: AC

## 2021-03-27 MED ORDER — BISACODYL 10 MG RE SUPP: 10.0000 mg | Freq: Every day | RECTAL | Status: DC | PRN

## 2021-03-27 MED ORDER — PROBIOTIC 1-250 BILLION-MG PO CAPS: 1.0000 | ORAL_CAPSULE | ORAL | Status: DC

## 2021-03-27 MED ORDER — ACETAMINOPHEN 325 MG PO TABS: 325.0000 mg | ORAL_TABLET | Freq: Four times a day (QID) | ORAL | Status: DC | PRN

## 2021-03-27 MED ORDER — CHLORHEXIDINE GLUCONATE 4 % EX LIQD: 60.0000 mL | Freq: Once | CUTANEOUS | Status: DC

## 2021-03-27 MED ORDER — ONDANSETRON HCL 4 MG/2ML IJ SOLN: 4.0000 mg | Freq: Once | INTRAMUSCULAR | Status: DC | PRN

## 2021-03-27 MED ORDER — BUPIVACAINE HCL (PF) 0.5 % IJ SOLN
INTRAMUSCULAR | Status: AC
  Filled 2021-03-27: qty 20

## 2021-03-27 MED ORDER — CEFAZOLIN SODIUM-DEXTROSE 2-4 GM/100ML-% IV SOLN
2.0000 g | Freq: Four times a day (QID) | INTRAVENOUS | Status: AC
  Administered 2021-03-27 – 2021-03-28 (×2): 2 g via INTRAVENOUS
  Filled 2021-03-27 (×3): qty 100

## 2021-03-27 MED ORDER — SODIUM CHLORIDE 0.9 % IV SOLN
INTRAVENOUS | Status: DC | PRN
  Administered 2021-03-27: 60 mL

## 2021-03-27 MED ORDER — BUPIVACAINE HCL (PF) 0.25 % IJ SOLN
INTRAMUSCULAR | Status: DC | PRN
  Administered 2021-03-27: 60 mL

## 2021-03-27 MED ORDER — ONDANSETRON HCL 4 MG PO TABS: 4.0000 mg | ORAL_TABLET | Freq: Four times a day (QID) | ORAL | Status: DC | PRN

## 2021-03-27 MED ORDER — GABAPENTIN 300 MG PO CAPS
ORAL_CAPSULE | ORAL | Status: AC
  Administered 2021-03-27: 300 mg via ORAL
  Filled 2021-03-27: qty 1

## 2021-03-27 MED ORDER — OXYCODONE HCL 5 MG PO TABS
5.0000 mg | ORAL_TABLET | ORAL | Status: DC | PRN
Start: 2021-03-27 — End: 2021-03-28

## 2021-03-27 MED ORDER — HYDROMORPHONE HCL 1 MG/ML IJ SOLN
0.5000 mg | INTRAMUSCULAR | Status: DC | PRN
  Administered 2021-03-27 – 2021-03-28 (×2): 1 mg via INTRAVENOUS
  Filled 2021-03-27 (×2): qty 1

## 2021-03-27 MED ORDER — FENTANYL CITRATE (PF) 100 MCG/2ML IJ SOLN
INTRAMUSCULAR | Status: AC
  Administered 2021-03-27: 25 ug via INTRAVENOUS
  Filled 2021-03-27: qty 2

## 2021-03-27 MED ORDER — FENTANYL CITRATE (PF) 100 MCG/2ML IJ SOLN
INTRAMUSCULAR | Status: AC
  Filled 2021-03-27: qty 2

## 2021-03-27 MED ORDER — DIPHENHYDRAMINE HCL 12.5 MG/5ML PO ELIX: 12.5000 mg | ORAL_SOLUTION | ORAL | Status: DC | PRN

## 2021-03-27 MED ORDER — SODIUM CHLORIDE 0.9 % IV SOLN: INTRAVENOUS | Status: DC

## 2021-03-27 MED ORDER — ZINC SULFATE 220 (50 ZN) MG PO CAPS
220.0000 mg | ORAL_CAPSULE | Freq: Every day | ORAL | Status: DC
  Administered 2021-03-28: 220 mg via ORAL
  Filled 2021-03-27: qty 1

## 2021-03-27 MED ORDER — MIDAZOLAM HCL 5 MG/5ML IJ SOLN
INTRAMUSCULAR | Status: DC | PRN
  Administered 2021-03-27 (×2): 1 mg via INTRAVENOUS
  Administered 2021-03-27: 2 mg via INTRAVENOUS

## 2021-03-27 MED ORDER — ACETAMINOPHEN 10 MG/ML IV SOLN
INTRAVENOUS | Status: DC | PRN
  Administered 2021-03-27: 1000 mg via INTRAVENOUS

## 2021-03-27 MED ORDER — TRANEXAMIC ACID-NACL 1000-0.7 MG/100ML-% IV SOLN: 1000.0000 mg | Freq: Once | INTRAVENOUS | Status: AC

## 2021-03-27 MED ORDER — SENNOSIDES-DOCUSATE SODIUM 8.6-50 MG PO TABS
1.0000 | ORAL_TABLET | Freq: Two times a day (BID) | ORAL | Status: DC
  Administered 2021-03-27 – 2021-03-28 (×2): 1 via ORAL
  Filled 2021-03-27 (×2): qty 1

## 2021-03-27 MED ORDER — CHLORHEXIDINE GLUCONATE 0.12 % MT SOLN
OROMUCOSAL | Status: AC
  Administered 2021-03-27: 15 mL via OROMUCOSAL
  Filled 2021-03-27: qty 15

## 2021-03-27 MED ORDER — ACETAMINOPHEN 10 MG/ML IV SOLN
1000.0000 mg | Freq: Four times a day (QID) | INTRAVENOUS | Status: DC
  Administered 2021-03-27 – 2021-03-28 (×3): 1000 mg via INTRAVENOUS
  Filled 2021-03-27 (×3): qty 100

## 2021-03-27 MED ORDER — BUPIVACAINE HCL (PF) 0.5 % IJ SOLN
INTRAMUSCULAR | Status: DC | PRN
  Administered 2021-03-27: 3 mL

## 2021-03-27 MED ORDER — MENTHOL 3 MG MT LOZG
1.0000 | LOZENGE | OROMUCOSAL | Status: DC | PRN
  Filled 2021-03-27: qty 9

## 2021-03-27 MED ORDER — CEFAZOLIN SODIUM-DEXTROSE 2-4 GM/100ML-% IV SOLN
2.0000 g | INTRAVENOUS | Status: AC
  Administered 2021-03-27: 2 g via INTRAVENOUS

## 2021-03-27 MED ORDER — OXYCODONE HCL 5 MG PO TABS
10.0000 mg | ORAL_TABLET | ORAL | Status: DC | PRN
Start: 2021-03-27 — End: 2021-03-28
  Administered 2021-03-28 (×2): 10 mg via ORAL
  Filled 2021-03-27 (×3): qty 2

## 2021-03-27 MED ORDER — CELECOXIB 200 MG PO CAPS
200.0000 mg | ORAL_CAPSULE | Freq: Two times a day (BID) | ORAL | Status: DC
  Administered 2021-03-27 – 2021-03-28 (×2): 200 mg via ORAL
  Filled 2021-03-27 (×2): qty 1

## 2021-03-27 MED ORDER — SURGIPHOR WOUND IRRIGATION SYSTEM - OPTIME
TOPICAL | Status: DC | PRN
  Administered 2021-03-27: 1 via TOPICAL

## 2021-03-27 MED ORDER — MAGNESIUM HYDROXIDE 400 MG/5ML PO SUSP
30.0000 mL | Freq: Every day | ORAL | Status: DC
  Administered 2021-03-27 – 2021-03-28 (×2): 30 mL via ORAL
  Filled 2021-03-27: qty 30

## 2021-03-27 MED ORDER — PROPOFOL 10 MG/ML IV BOLUS
INTRAVENOUS | Status: AC
  Filled 2021-03-27: qty 20

## 2021-03-27 MED ORDER — PHENOL 1.4 % MT LIQD
1.0000 | OROMUCOSAL | Status: DC | PRN
  Filled 2021-03-27: qty 177

## 2021-03-27 MED ORDER — DEXAMETHASONE SODIUM PHOSPHATE 10 MG/ML IJ SOLN
INTRAMUSCULAR | Status: AC
  Administered 2021-03-27: 8 mg via INTRAVENOUS
  Filled 2021-03-27: qty 1

## 2021-03-27 MED ORDER — CELECOXIB 200 MG PO CAPS
ORAL_CAPSULE | ORAL | Status: AC
  Administered 2021-03-27: 400 mg via ORAL
  Filled 2021-03-27: qty 2

## 2021-03-27 MED ORDER — ONDANSETRON HCL 4 MG/2ML IJ SOLN: 4.0000 mg | Freq: Four times a day (QID) | INTRAMUSCULAR | Status: DC | PRN

## 2021-03-27 MED ORDER — RISAQUAD PO CAPS
1.0000 | ORAL_CAPSULE | Freq: Every day | ORAL | Status: DC
  Administered 2021-03-28: 1 via ORAL
  Filled 2021-03-27: qty 1

## 2021-03-27 MED ORDER — LATANOPROST 0.005 % OP SOLN
1.0000 [drp] | Freq: Every day | OPHTHALMIC | Status: DC
  Administered 2021-03-27: 1 [drp] via OPHTHALMIC
  Filled 2021-03-27: qty 2.5

## 2021-03-27 MED ORDER — DEXAMETHASONE SODIUM PHOSPHATE 10 MG/ML IJ SOLN: 8.0000 mg | Freq: Once | INTRAMUSCULAR | Status: AC

## 2021-03-27 MED ORDER — TRANEXAMIC ACID-NACL 1000-0.7 MG/100ML-% IV SOLN
INTRAVENOUS | Status: AC
  Filled 2021-03-27: qty 100

## 2021-03-27 MED ORDER — TRANEXAMIC ACID-NACL 1000-0.7 MG/100ML-% IV SOLN
INTRAVENOUS | Status: AC
  Administered 2021-03-27: 1000 mg via INTRAVENOUS
  Filled 2021-03-27: qty 100

## 2021-03-27 MED ORDER — ENOXAPARIN SODIUM 30 MG/0.3ML ~~LOC~~ SOLN
30.0000 mg | Freq: Two times a day (BID) | SUBCUTANEOUS | Status: DC
  Administered 2021-03-28: 30 mg via SUBCUTANEOUS
  Filled 2021-03-27: qty 0.3

## 2021-03-27 MED ORDER — ONDANSETRON HCL 4 MG/2ML IJ SOLN
INTRAMUSCULAR | Status: DC | PRN
  Administered 2021-03-27: 4 mg via INTRAVENOUS

## 2021-03-27 SURGICAL SUPPLY — 74 items
ATTUNE PS FEM RT SZ 4 CEM KNEE (Femur) ×1 IMPLANT
ATTUNE PSRP INSR SZ4 7 KNEE (Insert) ×1 IMPLANT
BASEPLATE TIBIAL ROTATING SZ 4 (Knees) ×1 IMPLANT
BATTERY INSTRU NAVIGATION (MISCELLANEOUS) ×8 IMPLANT
BLADE SAW 70X12.5 (BLADE) ×2 IMPLANT
BLADE SAW 90X13X1.19 OSCILLAT (BLADE) ×2 IMPLANT
BLADE SAW 90X25X1.19 OSCILLAT (BLADE) ×2 IMPLANT
BONE CEMENT GENTAMICIN (Cement) ×4 IMPLANT
CANISTER SUCT 3000ML PPV (MISCELLANEOUS) ×2 IMPLANT
CEMENT BONE GENTAMICIN 40 (Cement) IMPLANT
COOLER POLAR GLACIER W/PUMP (MISCELLANEOUS) ×2 IMPLANT
COVER WAND RF STERILE (DRAPES) ×2 IMPLANT
CUFF TOURN SGL QUICK 24 (TOURNIQUET CUFF)
CUFF TOURN SGL QUICK 30 (TOURNIQUET CUFF)
CUFF TRNQT CYL 24X4X16.5-23 (TOURNIQUET CUFF) IMPLANT
CUFF TRNQT CYL 30X4X21-28X (TOURNIQUET CUFF) IMPLANT
DRAPE 3/4 80X56 (DRAPES) ×2 IMPLANT
DRESSING PEEL AND PLAC PRVNA20 (GAUZE/BANDAGES/DRESSINGS) ×1 IMPLANT
DRSG DERMACEA 8X12 NADH (GAUZE/BANDAGES/DRESSINGS) ×2 IMPLANT
DRSG MEPILEX SACRM 8.7X9.8 (GAUZE/BANDAGES/DRESSINGS) ×2 IMPLANT
DRSG OPSITE POSTOP 4X14 (GAUZE/BANDAGES/DRESSINGS) ×2 IMPLANT
DRSG PEEL AND PLACE PREVENA 20 (GAUZE/BANDAGES/DRESSINGS) ×2
DRSG TEGADERM 4X4.75 (GAUZE/BANDAGES/DRESSINGS) ×2 IMPLANT
DURAPREP 26ML APPLICATOR (WOUND CARE) ×4 IMPLANT
ELECT CAUTERY BLADE 6.4 (BLADE) ×2 IMPLANT
ELECT REM PT RETURN 9FT ADLT (ELECTROSURGICAL) ×2
ELECTRODE REM PT RTRN 9FT ADLT (ELECTROSURGICAL) ×1 IMPLANT
EX-PIN ORTHOLOCK NAV 4X150 (PIN) ×4 IMPLANT
GLOVE SURG ENC MOIS LTX SZ7.5 (GLOVE) ×4 IMPLANT
GLOVE SURG ENC TEXT LTX SZ7.5 (GLOVE) ×4 IMPLANT
GLOVE SURG UNDER LTX SZ8 (GLOVE) ×2 IMPLANT
GLOVE SURG UNDER POLY LF SZ7.5 (GLOVE) ×2 IMPLANT
GOWN STRL REUS W/ TWL LRG LVL3 (GOWN DISPOSABLE) ×2 IMPLANT
GOWN STRL REUS W/ TWL XL LVL3 (GOWN DISPOSABLE) ×1 IMPLANT
GOWN STRL REUS W/TWL LRG LVL3 (GOWN DISPOSABLE) ×2
GOWN STRL REUS W/TWL XL LVL3 (GOWN DISPOSABLE) ×1
HEMOVAC 400CC 10FR (MISCELLANEOUS) ×2 IMPLANT
HOLDER FOLEY CATH W/STRAP (MISCELLANEOUS) ×2 IMPLANT
HOOD PEEL AWAY FLYTE STAYCOOL (MISCELLANEOUS) ×4 IMPLANT
IRRIGATION SURGIPHOR STRL (IV SOLUTION) ×2 IMPLANT
IV NS IRRIG 3000ML ARTHROMATIC (IV SOLUTION) ×2 IMPLANT
KIT TURNOVER KIT A (KITS) ×2 IMPLANT
KNIFE SCULPS 14X20 (INSTRUMENTS) ×2 IMPLANT
LABEL OR SOLS (LABEL) ×2 IMPLANT
MANIFOLD NEPTUNE II (INSTRUMENTS) ×4 IMPLANT
NDL SAFETY ECLIPSE 18X1.5 (NEEDLE) ×1 IMPLANT
NDL SPNL 20GX3.5 QUINCKE YW (NEEDLE) ×2 IMPLANT
NEEDLE HYPO 18GX1.5 SHARP (NEEDLE) ×1
NEEDLE SPNL 20GX3.5 QUINCKE YW (NEEDLE) ×4 IMPLANT
NS IRRIG 500ML POUR BTL (IV SOLUTION) ×2 IMPLANT
PACK TOTAL KNEE (MISCELLANEOUS) ×2 IMPLANT
PAD WRAPON POLAR KNEE (MISCELLANEOUS) ×1 IMPLANT
PATELLA MEDIAL ATTUN 35MM KNEE (Knees) ×1 IMPLANT
PENCIL SMOKE EVACUATOR COATED (MISCELLANEOUS) ×2 IMPLANT
PIN FIXATION 1/8DIA X 3INL (PIN) ×6 IMPLANT
PULSAVAC PLUS IRRIG FAN TIP (DISPOSABLE) ×2
SOL PREP PVP 2OZ (MISCELLANEOUS) ×2
SOLUTION PREP PVP 2OZ (MISCELLANEOUS) ×1 IMPLANT
SPONGE DRAIN TRACH 4X4 STRL 2S (GAUZE/BANDAGES/DRESSINGS) ×2 IMPLANT
STAPLER SKIN PROX 35W (STAPLE) ×2 IMPLANT
STOCKINETTE IMPERV 14X48 (MISCELLANEOUS) IMPLANT
STRAP TIBIA SHORT (MISCELLANEOUS) ×2 IMPLANT
SUCTION FRAZIER HANDLE 10FR (MISCELLANEOUS) ×1
SUCTION TUBE FRAZIER 10FR DISP (MISCELLANEOUS) ×1 IMPLANT
SUT VIC AB 0 CT1 36 (SUTURE) ×4 IMPLANT
SUT VIC AB 1 CT1 36 (SUTURE) ×4 IMPLANT
SUT VIC AB 2-0 CT2 27 (SUTURE) ×2 IMPLANT
SYR 20ML LL LF (SYRINGE) ×2 IMPLANT
SYR 30ML LL (SYRINGE) ×4 IMPLANT
TIP FAN IRRIG PULSAVAC PLUS (DISPOSABLE) ×1 IMPLANT
TOWEL OR 17X26 4PK STRL BLUE (TOWEL DISPOSABLE) ×2 IMPLANT
TOWER CARTRIDGE SMART MIX (DISPOSABLE) ×2 IMPLANT
TRAY FOLEY MTR SLVR 16FR STAT (SET/KITS/TRAYS/PACK) ×2 IMPLANT
WRAPON POLAR PAD KNEE (MISCELLANEOUS) ×2

## 2021-03-27 NOTE — Transfer of Care (Signed)
Immediate Anesthesia Transfer of Care Note  Patient: Victoria Li  Procedure(s) Performed: COMPUTER ASSISTED TOTAL KNEE ARTHROPLASTY (Right Knee)  Patient Location: PACU  Anesthesia Type:Spinal  Level of Consciousness: sedated  Airway & Oxygen Therapy: Patient Spontanous Breathing and Patient connected to face mask oxygen  Post-op Assessment: Report given to RN and Post -op Vital signs reviewed and stable  Post vital signs: Reviewed and stable  Last Vitals:  Vitals Value Taken Time  BP 115/83 03/27/21 1612  Temp    Pulse 88 03/27/21 1612  Resp 23 03/27/21 1612  SpO2 97 % 03/27/21 1612    Last Pain:  Vitals:   03/27/21 0931  TempSrc: Temporal  PainSc: 0-No pain         Complications: No complications documented.

## 2021-03-27 NOTE — Anesthesia Procedure Notes (Signed)
Spinal  Patient location during procedure: OR Start time: 03/27/2021 12:16 PM End time: 03/27/2021 12:28 PM Reason for block: surgical anesthesia Staffing Performed: other anesthesia staff and resident/CRNA  Anesthesiologist: Martha Clan, MD Resident/CRNA: Lowry Bowl, CRNA Other anesthesia staff: Martie Round, RN Preanesthetic Checklist Completed: patient identified, IV checked, site marked, risks and benefits discussed, surgical consent, monitors and equipment checked, pre-op evaluation and timeout performed Spinal Block Patient position: sitting Prep: DuraPrep Patient monitoring: heart rate, cardiac monitor, continuous pulse ox and blood pressure Approach: midline Location: L3-4 Injection technique: single-shot Needle Needle type: Whitacre  Needle gauge: 22 G Needle length: 9 cm Assessment Sensory level: T4 Events: CSF return

## 2021-03-27 NOTE — Anesthesia Procedure Notes (Signed)
Procedure Name: MAC Date/Time: 03/27/2021 12:33 PM Performed by: Martie Round, RN Pre-anesthesia Checklist: Patient identified, Emergency Drugs available and Suction available Oxygen Delivery Method: Simple face mask Induction Type: IV induction

## 2021-03-27 NOTE — Anesthesia Preprocedure Evaluation (Addendum)
Anesthesia Evaluation  Patient identified by MRN, date of birth, ID band Patient awake    Reviewed: Allergy & Precautions, NPO status , Patient's Chart, lab work & pertinent test results  History of Anesthesia Complications Negative for: history of anesthetic complications  Airway Mallampati: III       Dental  (+) Teeth Intact, Dental Advidsory Given   Pulmonary neg pulmonary ROS,    Pulmonary exam normal breath sounds clear to auscultation       Cardiovascular negative cardio ROS Normal cardiovascular exam Rhythm:Regular Rate:Normal     Neuro/Psych negative neurological ROS  negative psych ROS   GI/Hepatic Neg liver ROS, GERD  Medicated and Controlled,  Endo/Other  negative endocrine ROS  Renal/GU Renal disease (kidney stone)  negative genitourinary   Musculoskeletal negative musculoskeletal ROS (+)   Abdominal   Peds negative pediatric ROS (+)  Hematology negative hematology ROS (+)   Anesthesia Other Findings Past Medical History: No date: Bruises easily No date: Chicken pox No date: Ehlers-Danlos syndrome - non vascular type (patient states that her doctor told her that she had the least severe type) No date: GERD (gastroesophageal reflux disease) No date: Glaucoma No date: Hammer toe No date: Multiple lung nodules on CT No date: Swelling   Reproductive/Obstetrics negative OB ROS                            Anesthesia Physical  Anesthesia Plan  ASA: III  Anesthesia Plan: Spinal   Post-op Pain Management:    Induction: Intravenous  PONV Risk Score and Plan: 3 and TIVA and Propofol infusion  Airway Management Planned: Natural Airway and Simple Face Mask  Additional Equipment:   Intra-op Plan:   Post-operative Plan:   Informed Consent: I have reviewed the patients History and Physical, chart, labs and discussed the procedure including the risks, benefits and  alternatives for the proposed anesthesia with the patient or authorized representative who has indicated his/her understanding and acceptance.       Plan Discussed with: CRNA, Anesthesiologist and Surgeon  Anesthesia Plan Comments:         Anesthesia Quick Evaluation

## 2021-03-27 NOTE — H&P (Signed)
The patient has been re-examined, and the chart reviewed, and there have been no interval changes to the documented history and physical.    The risks, benefits, and alternatives have been discussed at length. The patient expressed understanding of the risks benefits and agreed with plans for surgical intervention.  Anwita Mencer P. Ramir Malerba, Jr. M.D.    

## 2021-03-27 NOTE — Op Note (Signed)
OPERATIVE NOTE  DATE OF SURGERY:  03/27/2021  PATIENT NAME:  Victoria Li   DOB: October 14, 1946  MRN: [id number]  PRE-OPERATIVE DIAGNOSIS: Degenerative arthrosis of the right knee, primary  POST-OPERATIVE DIAGNOSIS:  Same  PROCEDURE:  Right total knee arthroplasty using computer-assisted navigation  SURGEON:  Marciano Sequin. M.D.  ASSISTANT: Cassell Smiles, PA-C (present and scrubbed throughout the case, critical for assistance with exposure, retraction, instrumentation, and closure)  ANESTHESIA: spinal  ESTIMATED BLOOD LOSS: 50 mL  FLUIDS REPLACED: 1400 mL of crystalloid  TOURNIQUET TIME: 82 minutes  DRAINS: 2 medium Hemovac  SOFT TISSUE RELEASES: Anterior cruciate ligament, posterior cruciate ligament, deep medial collateral ligament, patellofemoral ligament  IMPLANTS UTILIZED: DePuy Attune size 4 posterior stabilized femoral component (cemented), size 4 rotating platform tibial component (cemented), 35 mm medialized dome patella (cemented), and a 7 mm stabilized rotating platform polyethylene insert.  INDICATIONS FOR SURGERY: Victoria Li is a 75 y.o. year old female with a long history of progressive knee pain. X-rays demonstrated severe degenerative changes in tricompartmental fashion. The patient had not seen any significant improvement despite conservative nonsurgical intervention. After discussion of the risks and benefits of surgical intervention, the patient expressed understanding of the risks benefits and agree with plans for total knee arthroplasty.   The risks, benefits, and alternatives were discussed at length including but not limited to the risks of infection, bleeding, nerve injury, stiffness, blood clots, the need for revision surgery, cardiopulmonary complications, among others, and they were willing to proceed.  PROCEDURE IN DETAIL: The patient was brought into the operating room and, after adequate spinal anesthesia was achieved, a tourniquet was  placed on the patient's upper thigh. The patient's knee and leg were cleaned and prepped with alcohol and DuraPrep and draped in the usual sterile fashion. A "timeout" was performed as per usual protocol. The lower extremity was exsanguinated using an Esmarch, and the tourniquet was inflated to 300 mmHg. An anterior longitudinal incision was made followed by a standard mid vastus approach. The deep fibers of the medial collateral ligament were elevated in a subperiosteal fashion off of the medial flare of the tibia so as to maintain a continuous soft tissue sleeve. The patella was subluxed laterally and the patellofemoral ligament was incised. Inspection of the knee demonstrated severe degenerative changes with full-thickness loss of articular cartilage. Osteophytes were debrided using a rongeur. Anterior and posterior cruciate ligaments were excised. Two 4.0 mm Schanz pins were inserted in the femur and into the tibia for attachment of the array of trackers used for computer-assisted navigation. Hip center was identified using a circumduction technique. Distal landmarks were mapped using the computer. The distal femur and proximal tibia were mapped using the computer. The distal femoral cutting guide was positioned using computer-assisted navigation so as to achieve a 5 distal valgus cut. The femur was sized and it was felt that a size 4 femoral component was appropriate. A size 4 femoral cutting guide was positioned and the anterior cut was performed and verified using the computer. This was followed by completion of the posterior and chamfer cuts. Femoral cutting guide for the central box was then positioned in the center box cut was performed.  Attention was then directed to the proximal tibia. Medial and lateral menisci were excised. The extramedullary tibial cutting guide was positioned using computer-assisted navigation so as to achieve a 0 varus-valgus alignment and 3 posterior slope. The cut was  performed and verified using the computer. The proximal tibia was sized  and it was felt that a size 4 tibial tray was appropriate. Tibial and femoral trials were inserted followed by insertion of a 7 mm polyethylene insert. This allowed for excellent mediolateral soft tissue balancing both in flexion and in full extension. Finally, the patella was cut and prepared so as to accommodate a 35 mm medialized dome patella. A patella trial was placed and the knee was placed through a range of motion with excellent patellar tracking appreciated. The femoral trial was removed after debridement of posterior osteophytes. The central post-hole for the tibial component was reamed followed by insertion of a keel punch. Tibial trials were then removed. Cut surfaces of bone were irrigated with copious amounts of normal saline using pulsatile lavage and then suctioned dry. Polymethylmethacrylate cement with gentamicin was prepared in the usual fashion using a vacuum mixer. Cement was applied to the cut surface of the proximal tibia as well as along the undersurface of a size 4 rotating platform tibial component. Tibial component was positioned and impacted into place. Excess cement was removed using [employer] fast streamer. Cement was then applied to the cut surfaces of the femur as well as along the posterior flanges of the size 4 femoral component. The femoral component was positioned and impacted into place. Excess cement was removed using [employer] fast streamer. A 7 mm polyethylene trial was inserted and the knee was brought into full extension with steady axial compression applied. Finally, cement was applied to the backside of a 35 mm medialized dome patella and the patellar component was positioned and patellar clamp applied. Excess cement was removed using [employer] fast streamer. After adequate curing of the cement, the tourniquet was deflated after a total tourniquet time of 82 minutes. Hemostasis was achieved using electrocautery. The knee was  irrigated with copious amounts of normal saline using pulsatile lavage followed by 500 ml of Surgiphor and then suctioned dry. 20 mL of 1.3% Exparel and 60 mL of 0.25% Marcaine in 40 mL of normal saline was injected along the posterior capsule, medial and lateral gutters, and along the arthrotomy site. A 7 mm stabilized rotating platform polyethylene insert was inserted and the knee was placed through a range of motion with excellent mediolateral soft tissue balancing appreciated and excellent patellar tracking noted. 2 medium drains were placed in the wound bed and brought out through separate stab incisions. The medial parapatellar portion of the incision was reapproximated using interrupted sutures of #1 Vicryl. Subcutaneous tissue was approximated in layers using first #0 Vicryl followed #2-0 Vicryl. The skin was approximated with skin staples and vertical mattress sutures of #3-0 nylon.  A sterile dressing was applied.  The patient tolerated the procedure well and was transported to the recovery room in stable condition.    Victoria Li., M.D.

## 2021-03-28 ENCOUNTER — Encounter: Payer: Self-pay | Admitting: Orthopedic Surgery

## 2021-03-28 MED ORDER — ENOXAPARIN SODIUM 40 MG/0.4ML ~~LOC~~ SOLN: 40.0000 mg | SUBCUTANEOUS | 0 refills | Status: DC

## 2021-03-28 MED ORDER — OXYCODONE HCL 5 MG PO TABS: 5.0000 mg | ORAL_TABLET | ORAL | 0 refills | Status: DC | PRN

## 2021-03-28 MED ORDER — CELECOXIB 200 MG PO CAPS: 200.0000 mg | ORAL_CAPSULE | Freq: Two times a day (BID) | ORAL | 0 refills | Status: DC

## 2021-03-28 NOTE — Progress Notes (Signed)
Physical Therapy Treatment Patient Details Name: Victoria Li MRN: [id number] DOB: 1946/03/08 Today's Date: 03/28/2021    History of Present Illness [name] diagnosed with degenerative arthrosis of the right knee and is s/p elective R TKA.  PMH includes: GERD, Ehlers-Danlos syndrome, and glaucoma.    PT Comments    Pt was pleasant and motivated to participate during the session and made good progress towards goals.  Pt was able to ambulate a total of 200+ feet with good stability and demonstrated good eccentric and concentric control during stair training.  Pt did require occasional verbal cues for correct sequencing during ambulation and stair training but family was present during the session and demonstrated good carryover of proper sequencing.  Pt's SpO2 and HR were both WNL during the session with no adverse symptoms reported other than mild to mod R knee pain.  Pt will benefit from HHPT upon discharge to safely address deficits listed in patient problem list for decreased caregiver assistance and eventual return to PLOF.    Follow Up Recommendations  Home health PT;Supervision for mobility/OOB     Equipment Recommendations  Rolling walker with 5" wheels    Recommendations for Other Services       Precautions / Restrictions Precautions Precautions: Fall Restrictions Weight Bearing Restrictions: Yes RLE Weight Bearing: Weight bearing as tolerated Other Position/Activity Restrictions: Pt able to perform Ind RLE SLR without extensor lag, no KI required    Mobility  Bed Mobility Overal bed mobility: Modified Independent             General bed mobility comments: Extra time and effort only    Transfers Overall transfer level: Needs assistance Equipment used: Rolling walker (2 wheeled) Transfers: Sit to/from Stand Sit to Stand: Min guard;From elevated surface Stand pivot transfers: Min guard       General transfer comment: Min to mod verbal and  visual cues for R foot and hand placement and for increased trunk flexion with dtr and spouse present for traininig  Ambulation/Gait Ambulation/Gait assistance: Min guard Gait Distance (Feet): 200 Feet Assistive device: Rolling walker (2 wheeled) Gait Pattern/deviations: Step-through pattern;Decreased stance time - right;Decreased step length - left;Step-to pattern;Antalgic Gait velocity: decreased   General Gait Details: Step-to pattern that quickly progressed to step-through pattern with min verbal cues to amb closer to the RW for general safety   Stairs Stairs: Yes Stairs assistance: Min guard Stair Management: Two rails;Alternating pattern;Forwards Number of Stairs: 4 General stair comments: Min to mod verbal and visual cues for proper sequencing with pt and spouse present for training; spouse educated on proper guarding techniques; pt demonstrated fair to good eccentric and concentric control and stability and was able to ascend/descend 4 steps x 2; verbal and visual education provided to pt and family on how pt would sequencing stairs using only one rail   Wheelchair Mobility    Modified Rankin (Stroke Patients Only)       Balance Overall balance assessment: Needs assistance Sitting-balance support: No upper extremity supported;Feet unsupported Sitting balance-Leahy Scale: Good     Standing balance support: During functional activity;Bilateral upper extremity supported Standing balance-Leahy Scale: Good Standing balance comment: Min lean on the RW for support                            Cognition Arousal/Alertness: Awake/alert Behavior During Therapy: WFL for tasks assessed/performed Overall Cognitive Status: Within Functional Limits for tasks assessed  General Comments: Pt is A & O x4 and very pleasant.      Exercises Total Joint Exercises Ankle Circles/Pumps: AROM;Both;10 reps Quad Sets:  AROM;Strengthening;Right;10 reps;5 reps Straight Leg Raises: AROM;Strengthening;Right;10 reps Long Arc Quad: AROM;Strengthening;Right;10 reps;15 reps Knee Flexion: AROM;Strengthening;Both;10 reps;15 reps Goniometric ROM: R knee AROM: 0-88 deg Marching in Standing: AROM;Strengthening;Both;5 reps;Standing Other Exercises Other Exercises: HEP review per handout Other Exercises: Positioning education and review with pt and family to encourage R knee ext PROM and prevent heel pressure Other Exercises: Car transfer sequencing education verbally and with simulated visual demonstration with pt and family    General Comments        Pertinent Vitals/Pain Pain Assessment: 0-10 Pain Score: 3  Pain Location: R knee Pain Descriptors / Indicators: Sore Pain Intervention(s): Premedicated before session;Monitored during session;Repositioned    Home Living Family/patient expects to be discharged to:: Private residence Living Arrangements: Spouse/significant other Available Help at Discharge: Available 24 hours/day;Family Type of Home: House Home Access: Stairs to enter Entrance Stairs-Rails: Right;Left;Can reach both Home Layout: One level Home Equipment: Cane - single point;Grab bars - toilet      Prior Function Level of Independence: Independent      Comments: Ind amb without AD, no fall history, Ind with ADLs   PT Goals (current goals can now be found in the care plan section) Acute Rehab PT Goals Patient Stated Goal: to go home today PT Goal Formulation: With patient Time For Goal Achievement: 04/10/21 Potential to Achieve Goals: Good Progress towards PT goals: Progressing toward goals    Frequency    BID      PT Plan Current plan remains appropriate    Co-evaluation              AM-PAC PT "6 Clicks" Mobility   Outcome Measure  Help needed turning from your back to your side while in a flat bed without using bedrails?: A Little Help needed moving from lying on  your back to sitting on the side of a flat bed without using bedrails?: A Little Help needed moving to and from a bed to a chair (including a wheelchair)?: A Little Help needed standing up from a chair using your arms (e.g., wheelchair or bedside chair)?: A Little Help needed to walk in hospital room?: A Little Help needed climbing 3-5 steps with a railing? : A Little 6 Click Score: 18    End of Session Equipment Utilized During Treatment: Gait belt Activity Tolerance: Patient tolerated treatment well Patient left: in chair;with call bell/phone within reach;with chair alarm set;with SCD's reapplied;Other (comment);with family/visitor present (Polar care donned to R knee) Nurse Communication: Mobility status;Weight bearing status PT Visit Diagnosis: Muscle weakness (generalized) (M62.81);Pain;Other abnormalities of gait and mobility (R26.89) Pain - Right/Left: Right Pain - part of body: Knee     Time: 1402-1500 PT Time Calculation (min) (ACUTE ONLY): 58 min  Charges:  $Gait Training: 23-37 mins $Therapeutic Exercise: 8-22 mins $Therapeutic Activity: 8-22 mins                    D. Scott Tylyn Derwin PT, DPT 03/28/21, 3:55 PM

## 2021-03-28 NOTE — Discharge Summary (Signed)
Physician Discharge Summary  Patient ID: Victoria Li MRN: [id number] DOB/AGE: 73-Jun-1947 73 y.o.  Admit date: 03/27/2021 Discharge date: 03/28/2021  Admission Diagnoses:  Total knee replacement status [Z96.659]  Surgeries:Procedure(s): Right total knee arthroplasty using computer-assisted navigation  SURGEON:  Marciano Sequin. M.D.  ASSISTANT: Cassell Smiles, PA-C (present and scrubbed throughout the case, critical for assistance with exposure, retraction, instrumentation, and closure)  ANESTHESIA: spinal  ESTIMATED BLOOD LOSS: 50 mL  FLUIDS REPLACED: 1400 mL of crystalloid  TOURNIQUET TIME: 82 minutes  DRAINS: 2 medium Hemovac  SOFT TISSUE RELEASES: Anterior cruciate ligament, posterior cruciate ligament, deep medial collateral ligament, patellofemoral ligament  IMPLANTS UTILIZED: DePuy Attune size 4 posterior stabilized femoral component (cemented), size 4 rotating platform tibial component (cemented), 35 mm medialized dome patella (cemented), and a 7 mm stabilized rotating platform polyethylene insert.  Discharge Diagnoses: Patient Active Problem List   Diagnosis Date Noted  . Total knee replacement status 03/27/2021  . Pelvic cramping 03/26/2021  . Primary osteoarthritis of left knee 11/11/2020  . Primary osteoarthritis of right knee 11/11/2020  . Ehlers-Danlos syndrome 06/18/2020  . Pain of left hand 01/08/2018  . Laceration of finger 01/08/2018  . Family history of colon cancer 03/09/2015  . Hypoxemia 12/07/2009  . 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 Calculus of kidney 05/17/2007    Past Medical History:  Diagnosis Date  . Bruises easily   . Chicken pox   . Ehlers-Danlos syndrome   . GERD (gastroesophageal reflux disease)   . Glaucoma   . Hammer toe   . Multiple lung nodules on CT   . Swelling      Transfusion: n/a   Consultants (if any):   Discharged Condition: Improved  Hospital Course: Victoria Li is an 75 y.o. female who was  admitted 03/27/2021 with a diagnosis of right knee osteoarthritis and went to the operating room on 03/27/2021 and underwent right total knee arthroplasty. The patient received perioperative antibiotics for prophylaxis (see below). The patient tolerated the procedure well and was transported to PACU in stable condition. After meeting PACU criteria, the patient was subsequently transferred to the Orthopaedics/Rehabilitation unit.   The patient received DVT prophylaxis in the form of early mobilization, Lovenox and TED hose. A sacral pad had been placed and heels were elevated off of the bed with rolled towels in order to protect skin integrity. Foley catheter was discontinued on postoperative day #0. Wound drains were discontinued on postoperative day #1. The surgical incision was healing well without signs of infection.  Physical therapy was initiated postoperatively for transfers, gait training, and strengthening. Occupational therapy was initiated for activities of daily living and evaluation for assisted devices. Rehabilitation goals were reviewed in detail with the patient. The patient made steady progress with physical therapy and physical therapy recommended discharge to Home.   The patient achieved the preliminary goals of this hospitalization and was felt to be medically and orthopaedically appropriate for discharge.  She was given perioperative antibiotics:  Anti-infectives (From admission, onward)   Start     Dose/Rate Route Frequency Ordered Stop   03/27/21 1900  ceFAZolin (ANCEF) IVPB 2g/100 mL premix        2 g 200 mL/hr over 30 Minutes Intravenous Every 6 hours 03/27/21 1811 03/28/21 0540   03/27/21 0921  ceFAZolin (ANCEF) 2-4 GM/100ML-% IVPB       Note to Pharmacy: Olena Mater   : cabinet override      03/27/21 4656 03/27/21 2129   03/27/21 0915  ceFAZolin (ANCEF) IVPB 2g/100 mL premix        2 g 200 mL/hr over 30 Minutes Intravenous On call to O.R. 03/27/21 3382 03/27/21 1239     .  Recent vital signs:  Vitals:   03/28/21 0825 03/28/21 1602  BP: (!) 115/55 (!) 96/51  Pulse: 61 (!) 58  Resp: 19 18  Temp: 98 F (36.7 C) 98.2 F (36.8 C)  SpO2: 93% 96%    Recent laboratory studies:  No results for input(s): WBC, HGB, HCT, PLT, K, CL, CO2, BUN, CREATININE, GLUCOSE, CALCIUM, LABPT, INR in the last 72 hours.  Diagnostic Studies: CT ABDOMEN PELVIS W WO CONTRAST  Result Date: 03/13/2021 CLINICAL DATA:  History of renal stones of status stone burden. EXAM: CT ABDOMEN AND PELVIS WITHOUT AND WITH CONTRAST TECHNIQUE: Multidetector CT imaging of the abdomen and pelvis was performed following the standard protocol before and following the bolus administration of intravenous contrast. CONTRAST:  171mL OMNIPAQUE IOHEXOL 300 MG/ML  SOLN COMPARISON:  None. FINDINGS: Lower chest: No acute abnormality. Hepatobiliary: Scattered hypodense hepatic lesions for instance in the right lobe of the liver on image 23/2 there is a 1.2 cm hypodense lesion, which likely represent benign hepatic cysts. No suspicious solid hepatic lesion. Gallbladder is unremarkable. No biliary ductal dilation. Pancreas: Unremarkable Spleen: Unremarkable Adrenals/Urinary Tract: Bilateral adrenal glands are unremarkable. No renal, ureteral or bladder calculi. No hydronephrosis. Symmetric enhancement and excretion of contrast in bilateral kidneys. No suspicious filling defect visualized within the opacified portions of the collecting system on delayed imaging. No solid enhancing renal masses. Symmetric bladder Minella thickening of a predominantly decompressed urinary bladder. Stomach/Bowel: Small hiatal hernia, otherwise unremarkable decompressed appearance of the stomach. No evidence of small bowel dilation or inflammation. Terminal ileum is unremarkable. Appendix is not definitely visualized however there is no pericecal inflammation. No suspicious colonic Nienow thickening or mass like lesions. Descending and sigmoid colonic  diverticulosis without findings of acute diverticulitis. Vascular/Lymphatic: Aortic atherosclerosis. No enlarged abdominal or pelvic lymph nodes. Reproductive: Uterus is retroverted with lobular contour and multiple dystrophic calcifications likely representing leiomyomas. Bilateral adnexa unremarkable. Other: No abdominopelvic ascites. Musculoskeletal: Multilevel degenerative changes spine. No acute osseous abnormality. IMPRESSION: 1. No renal, ureteral, or bladder calculi. No hydronephrosis. 2. Symmetric bladder Crichlow thickening of a predominantly decompressed urinary bladder. Correlate with urinalysis to exclude the possibility of cystitis. 3. Descending and sigmoid colonic diverticulosis without findings of acute diverticulitis. 4. Small hiatal hernia. 5. Aortic atherosclerosis. Aortic Atherosclerosis (ICD10-I70.0). Electronically Signed   By: Dahlia Bailiff MD   On: 03/13/2021 11:20   DG Knee Right Port  Result Date: 03/27/2021 CLINICAL DATA:  Status post total knee replacement EXAM: PORTABLE RIGHT KNEE - 1-2 VIEW COMPARISON:  None. FINDINGS: Frontal and lateral views were obtained. Patient is status post total knee replacement with prosthetic components well-seated. No fracture or dislocation. There is a drain in the knee joint region with overlying skin staples. Soft tissue air noted. IMPRESSION: Status post total knee replacement with prosthetic components well-seated. No fracture or dislocation. Acute postoperative changes noted. Electronically Signed   By: Lowella Grip III M.D.   On: 03/27/2021 16:55    Discharge Medications:   Allergies as of 03/28/2021   No Known Allergies     Medication List    STOP taking these medications   acetaminophen 500 MG tablet Commonly known as: TYLENOL   brimonidine 0.2 % ophthalmic solution Commonly known as: ALPHAGAN   CALCIUM-VITAMIN D PO   Docusate Sodium 100 MG  capsule   famotidine 20 MG tablet Commonly known as: PEPCID   latanoprost 0.005  % ophthalmic solution Commonly known as: XALATAN   MULTIVITAMIN PO   PROBIOTIC PO   timolol 0.5 % ophthalmic solution Commonly known as: TIMOPTIC   zinc gluconate 50 MG tablet     TAKE these medications   celecoxib 200 MG capsule Commonly known as: CELEBREX Take 1 capsule (200 mg total) by mouth 2 (two) times daily.   enoxaparin 40 MG/0.4ML injection Commonly known as: LOVENOX Inject 0.4 mLs (40 mg total) into the skin daily for 14 days.   oxyCODONE 5 MG immediate release tablet Commonly known as: Oxy IR/ROXICODONE Take 1 tablet (5 mg total) by mouth every 4 (four) hours as needed for moderate pain (pain score 4-6).            Durable Medical Equipment  (From admission, onward)         Start     Ordered   03/27/21 1812  DME Walker rolling  Once       Question:  Patient needs a walker to treat with the following condition  Answer:  Total knee replacement status   03/27/21 1811   03/27/21 1812  DME Bedside commode  Once       Question:  Patient needs a bedside commode to treat with the following condition  Answer:  Total knee replacement status   03/27/21 1811          Disposition: home with home health PT      Follow-up Information    [facility] On 04/11/2021.   Specialty: Orthopedic Surgery Why: at 1:15pm Contact information: [place] [address] [phone]        Dereck Leep, MD On 05/09/2021.   Specialty: Orthopedic Surgery Why: at 9:45am Contact information: [place] [address] [place], PA-C 03/28/2021, 5:32 PM

## 2021-03-28 NOTE — Evaluation (Signed)
Occupational Therapy Evaluation Patient Details Name: Victoria Li MRN: [id number] DOB: 1946/09/08 Today's Date: 03/28/2021    History of Present Illness 75 y/o female s/p R TKA.   Clinical Impression   Patient presenting with decreased I in self care, balance, functional mobility/transfers, endurance, and safety awareness.  Patient reports being independent at home with husband  PTA. Pt does not use any AD at baseline but has made some modifications for home set up in order to increase I with self care tasks. OT educated and provided paper handout for polar care and LB ADLs. They verbalized understanding.  Patient currently functioning at min A to stand from [facility] and recliner chair. Pt ambulating with min guard progressing to close supervision this session. Pt needing min cuing for technique and hand placement. Pt needing min guard for balance for clothing management and hygiene with toileting needs this session.  Patient will benefit from acute OT to increase overall independence in the areas of ADLs, functional mobility, and safety awareness in order to safely discharge home with family.    Follow Up Recommendations  No OT follow up;Supervision - Intermittent    Equipment Recommendations  Other (comment) (RW)       Precautions / Restrictions Precautions Precautions: Fall Restrictions Weight Bearing Restrictions: Yes RLE Weight Bearing: Weight bearing as tolerated      Mobility Bed Mobility Overal bed mobility: Needs Assistance             General bed mobility comments: received in recliner chair and returned at end of session    Transfers Overall transfer level: Needs assistance Equipment used: Rolling walker (2 wheeled) Transfers: Sit to/from [facility] Sit to Stand: Min assist Stand pivot transfers: Min guard       General transfer comment: min cuing for hand placement and technique    Balance Overall balance assessment: Needs  assistance Sitting-balance support: Feet supported Sitting balance-Leahy Scale: Good     Standing balance support: During functional activity Standing balance-Leahy Scale: Fair                             ADL either performed or assessed with clinical judgement   ADL Overall ADL's : Needs assistance/impaired     Grooming: Wash/dry hands;Wash/dry face;Standing;Supervision/safety       Lower Body Bathing: Minimal assistance;Sit to/from stand       Lower Body Dressing: Minimal assistance;Sit to/from stand   Toilet Transfer: Minimal assistance;BSC;RW;Cueing for safety;Cueing for sequencing   Toileting- Clothing Manipulation and Hygiene: Min guard;Sit to/from stand       Functional mobility during ADLs: Min guard;Supervision/safety;Rolling walker General ADL Comments: Pt needing min A to stand from lower heights with min cuing for technique and hand placement. Pt ambulating with min guard progressing to supervision.     Vision Patient Visual Report: No change from baseline              Pertinent Vitals/Pain Pain Assessment: No/denies pain     Hand Dominance Right   Extremity/Trunk Assessment Upper Extremity Assessment Upper Extremity Assessment: Overall WFL for tasks assessed   Lower Extremity Assessment Lower Extremity Assessment: Defer to PT evaluation       Communication Communication Communication: No difficulties   Cognition Arousal/Alertness: Awake/alert Behavior During Therapy: WFL for tasks assessed/performed Overall Cognitive Status: Within Functional Limits for tasks assessed  General Comments: Pt is A & O x4 and very pleasant.              Home Living Family/patient expects to be discharged to:: Private residence Living Arrangements: Spouse/significant other Available Help at Discharge: Available 24 hours/day Type of Home: House Home Access: Stairs to enter [employer]  of Steps: 8 STE from garage or 4-5 STE front door with 40-50' sidewalk Entrance Stairs-Rails: Right;Left;Can reach both Home Layout: One level     Bathroom Shower/Tub: [place]: [place] - single point;Grab bars - toilet          Prior Functioning/Environment Level of Independence: Independent        Comments: Pt reports making modifications at home prior to surgery but not using any AD.        OT Problem List: Decreased strength;Decreased activity tolerance;Decreased knowledge of use of DME or AE;Decreased safety awareness;Impaired balance (sitting and/or standing);Decreased knowledge of precautions;Decreased range of motion      OT Treatment/Interventions: Self-care/ADL training;Therapeutic exercise;Energy conservation;DME and/or AE instruction;Therapeutic activities;Balance training;Patient/family education;Modalities;Manual therapy    OT Goals(Current goals can be found in the care plan section) Acute Rehab OT Goals Patient Stated Goal: to go home today OT Goal Formulation: With patient/family Time For Goal Achievement: 04/11/21 Potential to Achieve Goals: Good ADL Goals Pt Will Perform Grooming: with modified independence;standing Pt Will Perform Lower Body Dressing: with modified independence;sit to/from stand Pt Will Transfer to Toilet: with modified independence;ambulating Pt Will Perform Toileting - Clothing Manipulation and hygiene: with modified independence;sit to/from stand  OT Frequency: Min 2X/week   Barriers to D/C:    none known at this time          AM-PAC OT "6 Clicks" Daily Activity     Outcome Measure Help from another person eating meals?: None Help from another person taking care of personal grooming?: A Little Help from another person toileting, which includes using toliet, bedpan, or urinal?: A Little Help from another person bathing (including washing, rinsing, drying)?: A Little Help from another person to put  on and taking off regular upper body clothing?: None Help from another person to put on and taking off regular lower body clothing?: A Little 6 Click Score: 20   End of Session Equipment Utilized During Treatment: Rolling walker Nurse Communication: Mobility status  Activity Tolerance: Patient tolerated treatment well Patient left: in chair;with call bell/phone within reach;with family/visitor present  OT Visit Diagnosis: Unsteadiness on feet (R26.81);Muscle weakness (generalized) (M62.81)                Time: 8850-2774 OT Time Calculation (min): 43 min Charges:  OT General Charges $OT Visit: 1 Visit OT Evaluation $OT Eval Low Complexity: 1 Low OT Treatments $Self Care/Home Management : 8-22 mins $Therapeutic Activity: 8-22 mins  Darleen Crocker, MS, OTR/L , CBIS ascom [phone]  03/28/21, 12:51 PM

## 2021-03-28 NOTE — Plan of Care (Signed)

## 2021-03-28 NOTE — Progress Notes (Signed)
  Subjective: 1 Day Post-Op Procedure(s) (LRB): COMPUTER ASSISTED TOTAL KNEE ARTHROPLASTY (Right) Patient reports pain as well-controlled at this time. Patient's husband at bedside. Patient is well, and has had no acute complaints or problems Plan is to go Home after hospital stay. Negative for chest pain and shortness of breath Fever: no Gastrointestinal: negative for nausea and vomiting.   Patient has not had a bowel movement.  Objective: Vital signs in last 24 hours: Temp:  [97 F (36.1 C)-98 F (36.7 C)] 98 F (36.7 C) (03/31 0825) Pulse Rate:  [61-92] 61 (03/31 0825) Resp:  [8-24] 19 (03/31 0825) BP: (102-128)/(52-83) 115/55 (03/31 0825) SpO2:  [90 %-97 %] 93 % (03/31 0825) Weight:  [90.7 kg] 90.7 kg (03/30 1803)  Intake/Output from previous day:  Intake/Output Summary (Last 24 hours) at 03/28/2021 1033 Last data filed at 03/28/2021 1028 Gross per 24 hour  Intake 3330 ml  Output 1105 ml  Net 2225 ml    Intake/Output this shift: Total I/O In: 120 [P.O.:120] Out: 75 [Drains:75]  Labs: No results for input(s): HGB in the last 72 hours. No results for input(s): WBC, RBC, HCT, PLT in the last 72 hours. No results for input(s): NA, K, CL, CO2, BUN, CREATININE, GLUCOSE, CALCIUM in the last 72 hours. No results for input(s): LABPT, INR in the last 72 hours.   EXAM General - Patient is Alert, Appropriate and Oriented Extremity - Neurovascular intact Dorsiflexion/Plantar flexion intact Compartment soft Dressing/Incision -Postoperative dressing remains in place., Polar Care in place and working. , Hemovac in place.  Motor Function - intact, moving foot and toes well on exam.  Cardiovascular- Regular rate and rhythm, no murmurs/rubs/gallops Respiratory- Lungs clear to auscultation bilaterally Gastrointestinal- soft, nontender and active bowel sounds   Assessment/Plan: 1 Day Post-Op Procedure(s) (LRB): COMPUTER ASSISTED TOTAL KNEE ARTHROPLASTY (Right) Active  Problems:   Total knee replacement status  Estimated body mass index is 36.57 kg/m as calculated from the following:   Height as of this encounter: 5\' 2"  (1.575 m).   Weight as of this encounter: 90.7 kg. Advance diet Up with therapy Discharge home with home health pending completion of PT goals today. Patient expressed this is her goal and I agree. Will recheck this PM after clinic.    DVT Prophylaxis - Lovenox, Ted hose and foot pumps Weight-Bearing as tolerated to right leg  Cassell Smiles, PA-C [facility] Orthopaedic Surgery 03/28/2021, 10:33 AM

## 2021-03-28 NOTE — Plan of Care (Signed)
Post-op dressing removed. Hemovac removed. Fresh honeycomb dressing applied.  Mini compression dressing applied.  TED hose applied.  Patient ready for d/c home

## 2021-03-28 NOTE — Progress Notes (Signed)
Pt assisted to bed side commode then to recliner. Feet elevated in chair, hemovac emptied, 75 ml. Polar care on and running. All belongings placed with in reach including call bell.

## 2021-03-28 NOTE — Anesthesia Postprocedure Evaluation (Signed)
Anesthesia Post Note  Patient: Marguerita Beards Schuh  Procedure(s) Performed: COMPUTER ASSISTED TOTAL KNEE ARTHROPLASTY (Right Knee)  Patient location during evaluation: Nursing Unit Anesthesia Type: Spinal Level of consciousness: oriented and awake and alert Pain management: pain level controlled Vital Signs Assessment: post-procedure vital signs reviewed and stable Respiratory status: spontaneous breathing and respiratory function stable Cardiovascular status: blood pressure returned to baseline and stable Postop Assessment: no headache, no backache, no apparent nausea or vomiting and patient able to bend at knees Anesthetic complications: no   No complications documented.   Last Vitals:  Vitals:   03/27/21 2328 03/28/21 0408  BP: (!) 102/58 (!) 104/52  Pulse: 73 71  Resp: 18 17  Temp: 36.6 C 36.6 C  SpO2: 96% 96%    Last Pain:  Vitals:   03/28/21 0535  TempSrc:   PainSc: Asleep                 Wilberto Console B Clarisa Kindred

## 2021-03-28 NOTE — Evaluation (Signed)
Physical Therapy Evaluation Patient Details Name: Victoria Li MRN: [id number] DOB: 11-02-1946 Today's Date: 03/28/2021   History of Present Illness  [name] diagnosed with degenerative arthrosis of the right knee and is s/p elective R TKA.  PMH includes: GERD, Ehlers-Danlos syndrome, and glaucoma.    Clinical Impression  Pt was pleasant and motivated to participate during the session and overall performed very well with all functional tasks assessed.  Pt required no physical assistance with bed mob, transfers, or gait but did require training for general sequencing and safety.  Pt reported no adverse symptoms during the session other than mild R knee pain with SpO2 and HR WNL on room air.  Pt will benefit from HHPT upon discharge to safely address deficits listed in patient problem list for decreased caregiver assistance and eventual return to PLOF.      Follow Up Recommendations Home health PT;Supervision for mobility/OOB    Equipment Recommendations  Rolling walker with 5" wheels    Recommendations for Other Services       Precautions / Restrictions Precautions Precautions: Fall Restrictions Weight Bearing Restrictions: Yes RLE Weight Bearing: Weight bearing as tolerated Other Position/Activity Restrictions: Pt able to perform Ind RLE SLR without extensor lag, no KI required      Mobility  Bed Mobility Overal bed mobility: Modified Independent             General bed mobility comments: Extra time and effort only    Transfers Overall transfer level: Needs assistance Equipment used: Rolling walker (2 wheeled) Transfers: Sit to/from Stand Sit to Stand: Min guard;From elevated surface Stand pivot transfers: Min guard       General transfer comment: Min to mod verbal and visual cues for R foot and hand placement and for increased trunk flexion  Ambulation/Gait Ambulation/Gait assistance: Min guard Gait Distance (Feet): 125 Feet Assistive device:  Rolling walker (2 wheeled) Gait Pattern/deviations: Step-through pattern;Decreased stance time - right;Decreased step length - left;Step-to pattern;Antalgic Gait velocity: decreased   General Gait Details: Step-to pattern that gradually progressed to step-through pattern with min verbal cues to amb further back from the RW and for general safety with use of the walker  Stairs            Wheelchair Mobility    Modified Rankin (Stroke Patients Only)       Balance Overall balance assessment: Needs assistance Sitting-balance support: No upper extremity supported;Feet unsupported Sitting balance-Leahy Scale: Good     Standing balance support: During functional activity;Bilateral upper extremity supported Standing balance-Leahy Scale: Fair Standing balance comment: Pt required BUE support on the RW for stability                             Pertinent Vitals/Pain Pain Assessment: 0-10 Pain Score: 2  Pain Location: R knee Pain Descriptors / Indicators: Sore Pain Intervention(s): Premedicated before session;Monitored during session;RN gave pain meds during session    Victoria Li expects to be discharged to:: Private residence Living Arrangements: Spouse/significant other Available Help at Discharge: Available 24 hours/day;Family Type of Home: House Home Access: Stairs to enter Entrance Stairs-Rails: Right;Left;Can reach both Entrance Stairs-Number of Steps: 8 STE from garage or 4-5 STE front door with 40-50' sidewalk Home Layout: One level Home Equipment: [place] - single point;Grab bars - toilet      Prior Function Level of Independence: Independent         Comments: Ind amb  without AD, no fall history, Ind with ADLs     Hand Dominance   Dominant Hand: Right    Extremity/Trunk Assessment   Upper Extremity Assessment Upper Extremity Assessment: Overall WFL for tasks assessed    Lower Extremity Assessment Lower Extremity Assessment:  Generalized weakness;RLE deficits/detail RLE Deficits / Details: R hip flex >/= 3/5 RLE: Unable to fully assess due to pain       Communication   Communication: No difficulties  Cognition Arousal/Alertness: Awake/alert Behavior During Therapy: WFL for tasks assessed/performed Overall Cognitive Status: Within Functional Limits for tasks assessed                                 General Comments: Pt is A & O x4 and very pleasant.      General Comments      Exercises Total Joint Exercises Ankle Circles/Pumps: AROM;Both;10 reps Quad Sets: AROM;Strengthening;Right;10 reps;5 reps Straight Leg Raises: AROM;Strengthening;Right;10 reps Long Arc Quad: AROM;Strengthening;Right;10 reps;15 reps Knee Flexion: AROM;Strengthening;Both;10 reps;15 reps Goniometric ROM: R knee AROM: 0-88 deg Marching in Standing: AROM;Strengthening;Both;5 reps;Standing Other Exercises Other Exercises: HEP education and review per handout   Assessment/Plan    PT Assessment Patient needs continued PT services  PT Problem List Decreased strength;Decreased range of motion;Decreased activity tolerance;Decreased balance;Decreased mobility;Decreased knowledge of use of DME;Pain       PT Treatment Interventions DME instruction;Gait training;Stair training;Functional mobility training;Therapeutic activities;Therapeutic exercise;Balance training;Patient/family education    PT Goals (Current goals can be found in the Care Plan section)  Acute Rehab PT Goals Patient Stated Goal: to go home today PT Goal Formulation: With patient Time For Goal Achievement: 04/10/21 Potential to Achieve Goals: Good    Frequency BID   Barriers to discharge        Co-evaluation               AM-PAC PT "6 Clicks" Mobility  Outcome Measure Help needed turning from your back to your side while in a flat bed without using bedrails?: A Little Help needed moving from lying on your back to sitting on the side of a  flat bed without using bedrails?: A Little Help needed moving to and from a bed to a chair (including a wheelchair)?: A Little Help needed standing up from a chair using your arms (e.g., wheelchair or bedside chair)?: A Little Help needed to walk in hospital room?: A Little Help needed climbing 3-5 steps with a railing? : A Little 6 Click Score: 18    End of Session Equipment Utilized During Treatment: Gait belt Activity Tolerance: Patient tolerated treatment well Patient left: in chair;with call bell/phone within reach;with chair alarm set;with SCD's reapplied;Other (comment);with family/visitor present (polar care to R knee) Nurse Communication: Mobility status;Weight bearing status PT Visit Diagnosis: Muscle weakness (generalized) (M62.81);Pain;Other abnormalities of gait and mobility (R26.89) Pain - Right/Left: Right Pain - part of body: Knee    Time: 3500-9381 PT Time Calculation (min) (ACUTE ONLY): 39 min   Charges:   PT Evaluation $PT Eval Moderate Complexity: 1 Mod PT Treatments $Gait Training: 8-22 mins $Therapeutic Exercise: 8-22 mins        D. Scott Taeden Geller PT, DPT 03/28/21, 1:48 PM

## 2021-03-29 DIAGNOSIS — R531 Weakness: Secondary | ICD-10-CM | POA: Diagnosis not present

## 2021-03-30 DIAGNOSIS — Z89422 Acquired absence of other left toe(s): Secondary | ICD-10-CM | POA: Diagnosis not present

## 2021-03-30 DIAGNOSIS — Z9181 History of falling: Secondary | ICD-10-CM | POA: Diagnosis not present

## 2021-03-30 DIAGNOSIS — Z791 Long term (current) use of non-steroidal anti-inflammatories (NSAID): Secondary | ICD-10-CM | POA: Diagnosis not present

## 2021-03-30 DIAGNOSIS — Z89421 Acquired absence of other right toe(s): Secondary | ICD-10-CM | POA: Diagnosis not present

## 2021-03-30 DIAGNOSIS — Z471 Aftercare following joint replacement surgery: Secondary | ICD-10-CM | POA: Diagnosis not present

## 2021-03-30 DIAGNOSIS — H409 Unspecified glaucoma: Secondary | ICD-10-CM | POA: Diagnosis not present

## 2021-03-30 DIAGNOSIS — Z96651 Presence of right artificial knee joint: Secondary | ICD-10-CM | POA: Diagnosis not present

## 2021-03-30 DIAGNOSIS — Q796 Ehlers-Danlos syndrome, unspecified: Secondary | ICD-10-CM | POA: Diagnosis not present

## 2021-04-11 DIAGNOSIS — M25561 Pain in right knee: Secondary | ICD-10-CM | POA: Diagnosis not present

## 2021-04-11 DIAGNOSIS — Z96651 Presence of right artificial knee joint: Secondary | ICD-10-CM | POA: Diagnosis not present

## 2021-04-11 DIAGNOSIS — M6281 Muscle weakness (generalized): Secondary | ICD-10-CM | POA: Diagnosis not present

## 2021-04-11 DIAGNOSIS — M25661 Stiffness of right knee, not elsewhere classified: Secondary | ICD-10-CM | POA: Diagnosis not present

## 2021-04-16 DIAGNOSIS — Z96651 Presence of right artificial knee joint: Secondary | ICD-10-CM | POA: Diagnosis not present

## 2021-04-16 DIAGNOSIS — M25561 Pain in right knee: Secondary | ICD-10-CM | POA: Diagnosis not present

## 2021-04-19 DIAGNOSIS — M25561 Pain in right knee: Secondary | ICD-10-CM | POA: Diagnosis not present

## 2021-04-19 DIAGNOSIS — Z96651 Presence of right artificial knee joint: Secondary | ICD-10-CM | POA: Diagnosis not present

## 2021-04-23 DIAGNOSIS — Z96651 Presence of right artificial knee joint: Secondary | ICD-10-CM | POA: Diagnosis not present

## 2021-04-23 DIAGNOSIS — M25561 Pain in right knee: Secondary | ICD-10-CM | POA: Diagnosis not present

## 2021-04-25 DIAGNOSIS — Z96651 Presence of right artificial knee joint: Secondary | ICD-10-CM | POA: Diagnosis not present

## 2021-04-30 DIAGNOSIS — M25561 Pain in right knee: Secondary | ICD-10-CM | POA: Diagnosis not present

## 2021-04-30 DIAGNOSIS — Z96651 Presence of right artificial knee joint: Secondary | ICD-10-CM | POA: Diagnosis not present

## 2021-05-02 DIAGNOSIS — Z96651 Presence of right artificial knee joint: Secondary | ICD-10-CM | POA: Diagnosis not present

## 2021-05-02 DIAGNOSIS — M25561 Pain in right knee: Secondary | ICD-10-CM | POA: Diagnosis not present

## 2021-05-07 DIAGNOSIS — Z96651 Presence of right artificial knee joint: Secondary | ICD-10-CM | POA: Diagnosis not present

## 2021-05-24 DIAGNOSIS — Z96651 Presence of right artificial knee joint: Secondary | ICD-10-CM | POA: Diagnosis not present

## 2021-05-24 DIAGNOSIS — M1712 Unilateral primary osteoarthritis, left knee: Secondary | ICD-10-CM | POA: Diagnosis not present

## 2021-06-05 ENCOUNTER — Other Ambulatory Visit: Payer: Self-pay | Admitting: Obstetrics and Gynecology

## 2021-06-05 DIAGNOSIS — Z1231 Encounter for screening mammogram for malignant neoplasm of breast: Secondary | ICD-10-CM

## 2021-06-14 DIAGNOSIS — H2513 Age-related nuclear cataract, bilateral: Secondary | ICD-10-CM | POA: Diagnosis not present

## 2021-06-14 DIAGNOSIS — H401132 Primary open-angle glaucoma, bilateral, moderate stage: Secondary | ICD-10-CM | POA: Diagnosis not present

## 2021-06-20 DIAGNOSIS — Z124 Encounter for screening for malignant neoplasm of cervix: Secondary | ICD-10-CM | POA: Diagnosis not present

## 2021-07-04 ENCOUNTER — Ambulatory Visit
Admission: RE | Admit: 2021-07-04 | Discharge: 2021-07-04 | Disposition: A | Discharge status: Home / Self Care | Payer: PPO | Source: Ambulatory Visit | Attending: Obstetrics and Gynecology | Admitting: Obstetrics and Gynecology

## 2021-07-04 ENCOUNTER — Other Ambulatory Visit: Payer: Self-pay

## 2021-07-04 DIAGNOSIS — Z1231 Encounter for screening mammogram for malignant neoplasm of breast: Secondary | ICD-10-CM | POA: Insufficient documentation

## 2021-07-12 DIAGNOSIS — Z96651 Presence of right artificial knee joint: Secondary | ICD-10-CM | POA: Diagnosis not present

## 2021-07-12 DIAGNOSIS — M1712 Unilateral primary osteoarthritis, left knee: Secondary | ICD-10-CM | POA: Diagnosis not present

## 2021-07-19 ENCOUNTER — Telehealth: Payer: Self-pay | Admitting: Family Medicine

## 2021-07-19 ENCOUNTER — Telehealth: Payer: Self-pay | Admitting: *Deleted

## 2021-07-19 NOTE — Telephone Encounter (Signed)
PATIENT CALLED IN wanting to speak with Dr Rosanna Randy about something floating in her knee. Please call back

## 2021-07-19 NOTE — Telephone Encounter (Signed)
Pt states she saw on Xray that a staple had been left in knee post TKR in March. Wellsite geologist also noted staple remained "lower end of knee". Pt reports surgeon stated "We'll just watch it." Pt requesting Dr. Marlan Palau advise. Questioning if OK to leave in or should be removed. Denies any pain. Advised DR. Rosanna Randy is not  office today. Verbalizes understanding. Would like his input. CB# [phone]

## 2021-07-19 NOTE — Telephone Encounter (Signed)
Attempted to call patient about her knee symptoms- left message to call office. Not sure if this is a tiage call or informational call- patient active with orthopedic. (Dr Rosanna Randy not in office today)

## 2021-07-19 NOTE — Telephone Encounter (Signed)
Pt returning call. Please see telephone encounter.

## 2021-07-28 NOTE — Discharge Instructions (Signed)
Instructions after Total Knee Replacement   Meleana Commerford P. Deshanae Lindo, Jr., M.D.     Dept. of Orthopaedics & Sports Medicine  [facility]  [address]  [place], [place]  [address]  Phone: [phone]   Fax: [phone]    DIET: Drink plenty of non-alcoholic fluids. Resume your normal diet. Include foods high in fiber.  ACTIVITY:  You may use crutches or a walker with weight-bearing as tolerated, unless instructed otherwise. You may be weaned off of the walker or crutches by your Physical Therapist.  Do NOT place pillows under the knee. Anything placed under the knee could limit your ability to straighten the knee.   Continue doing gentle exercises. Exercising will reduce the pain and swelling, increase motion, and prevent muscle weakness.   Please continue to use the TED compression stockings for 6 weeks. You may remove the stockings at night, but should reapply them in the morning. Do not drive or operate any equipment until instructed.  WOUND CARE:  Continue to use the PolarCare or ice packs periodically to reduce pain and swelling. You may bathe or shower after the staples are removed at the first office visit following surgery.  MEDICATIONS: You may resume your regular medications. Please take the pain medication as prescribed on the medication. Do not take pain medication on an empty stomach. You have been given a prescription for a blood thinner (Lovenox or Coumadin). Please take the medication as instructed. (NOTE: After completing a 2 week course of Lovenox, take one Enteric-coated aspirin once a day. This along with elevation will help reduce the possibility of phlebitis in your operated leg.) Do not drive or drink alcoholic beverages when taking pain medications.  CALL THE OFFICE FOR: Temperature above 101 degrees Excessive bleeding or drainage on the dressing. Excessive swelling, coldness, or paleness of the toes. Persistent nausea and vomiting.  FOLLOW-UP:  You  should have an appointment to return to the office in 10-14 days after surgery. Arrangements have been made for continuation of Physical Therapy (either home therapy or outpatient therapy).   [facility] Department Directory         [email or web address]       [email or web address]          Cardiology  Appointments: [place] - [phone] [place] - [phone]  Endocrinology  Appointments: [place] - [phone] [place] - [phone]  Gastroenterology  Appointments: [place] - [phone] [place] - [phone]        General Surgery   Appointments: [place] - [phone]  Internal Medicine/Family Medicine  Appointments: [place] - [phone] [place] - [phone] [place] - [phone]  Metabolic and Weigh Loss Surgery  Appointments: [place] - [phone]        Neurology  Appointments: [place] - [phone] [place] - [phone]  Neurosurgery  Appointments: [place] - [phone]  Obstetrics & Gynecology  Appointments: [place] - [phone] [place] - [phone]        Pediatrics  Appointments: [place] - [phone] [place] - [phone]  Physiatry  Appointments: [place] -[phone]  Physical Therapy  Appointments: [place] - [phone] [place] - [phone]        Podiatry  Appointments: [place] - [phone] [place] - [phone]  Pulmonology  Appointments: [place] - [phone]  Rheumatology  Appointments: [place] - [phone]        [place] Location: [facility]  [address] [place], [place]  [address]  [place] Location: [facility] [address] [place], [place]  [address]  [place] Location: [facility] [address] [place], [place]  [address]    

## 2021-08-05 ENCOUNTER — Other Ambulatory Visit: Payer: Self-pay

## 2021-08-05 ENCOUNTER — Encounter
Admission: RE | Admit: 2021-08-05 | Discharge: 2021-08-05 | Disposition: A | Discharge status: Home / Self Care | Payer: PPO | Source: Ambulatory Visit | Attending: Orthopedic Surgery | Admitting: Orthopedic Surgery

## 2021-08-05 DIAGNOSIS — Z01812 Encounter for preprocedural laboratory examination: Secondary | ICD-10-CM | POA: Diagnosis not present

## 2021-08-05 LAB — CBC
HCT: 44.4 % (ref 36.0–46.0)
Hemoglobin: 14.7 g/dL (ref 12.0–15.0)
MCH: 29.9 pg (ref 26.0–34.0)
MCHC: 33.1 g/dL (ref 30.0–36.0)
MCV: 90.2 fL (ref 80.0–100.0)
Platelets: 297 10*3/uL (ref 150–400)
RBC: 4.92 MIL/uL (ref 3.87–5.11)
RDW: 13.7 % (ref 11.5–15.5)
WBC: 7 10*3/uL (ref 4.0–10.5)
nRBC: 0 % (ref 0.0–0.2)

## 2021-08-05 LAB — URINALYSIS, ROUTINE W REFLEX MICROSCOPIC
Bacteria, UA: NONE SEEN
Bilirubin Urine: NEGATIVE
Glucose, UA: NEGATIVE mg/dL
Hgb urine dipstick: NEGATIVE
Ketones, ur: NEGATIVE mg/dL
Nitrite: NEGATIVE
Protein, ur: NEGATIVE mg/dL
Specific Gravity, Urine: 1.024 (ref 1.005–1.030)
pH: 5 (ref 5.0–8.0)

## 2021-08-05 LAB — COMPREHENSIVE METABOLIC PANEL
ALT: 17 U/L (ref 0–44)
AST: 21 U/L (ref 15–41)
Albumin: 3.7 g/dL (ref 3.5–5.0)
Alkaline Phosphatase: 71 U/L (ref 38–126)
Anion gap: 6 (ref 5–15)
BUN: 12 mg/dL (ref 8–23)
CO2: 26 mmol/L (ref 22–32)
Calcium: 9.1 mg/dL (ref 8.9–10.3)
Chloride: 107 mmol/L (ref 98–111)
Creatinine, Ser: 0.58 mg/dL (ref 0.44–1.00)
GFR, Estimated: >60 mL/min (ref >60)
Glucose, Bld: 90 mg/dL (ref 70–99)
Potassium: 4 mmol/L (ref 3.5–5.1)
Sodium: 139 mmol/L (ref 135–145)
Total Bilirubin: 0.7 mg/dL (ref 0.3–1.2)
Total Protein: 7.3 g/dL (ref 6.5–8.1)

## 2021-08-05 LAB — PROTIME-INR
INR: 1 (ref 0.8–1.2)
Prothrombin Time: 12.8 seconds (ref 11.4–15.2)

## 2021-08-05 LAB — C-REACTIVE PROTEIN: CRP: 1.4 mg/dL — ABNORMAL HIGH (ref <1.0)

## 2021-08-05 LAB — SEDIMENTATION RATE: Sed Rate: 17 mm/hr (ref 0–30)

## 2021-08-05 LAB — APTT: aPTT: 31 seconds (ref 24–36)

## 2021-08-05 LAB — SURGICAL PCR SCREEN
MRSA, PCR: NEGATIVE
Staphylococcus aureus: POSITIVE — AB

## 2021-08-05 NOTE — Patient Instructions (Addendum)
Your procedure is scheduled on: Wednesday August 14, 2021. Report to Day Surgery inside [facility] 2nd floor stop by admissions desk first before getting on elevator. To find out your arrival time please call [phone] between 1PM - 3PM on Tuesday August 13, 2021.  Remember: Instructions that are not followed completely may result in serious medical risk,  up to and including death, or upon the discretion of your surgeon and anesthesiologist your  surgery may need to be rescheduled.     _X__ 1. Do not eat food after midnight the night before your procedure.                 No chewing gum or hard candies. You may drink clear liquids up to 2 hours                 before you are scheduled to arrive for your surgery- DO not drink clear                 liquids within 2 hours of the start of your surgery.                 Clear Liquids include:  water, apple juice without pulp, clear Gatorade, G2 or                  Gatorade Zero (avoid Red/Purple/Blue), Black Coffee or Tea (Do not add                 anything to coffee or tea).  __X__2.   Complete the "Ensure Clear Pre-surgery Clear Carbohydrate Drink" provided to you, 2 hours before arrival. **If you are diabetic you will be provided with an alternative drink, Gatorade Zero or G2.  __X__3.  On the morning of surgery brush your teeth with toothpaste and water, you                may rinse your mouth with mouthwash if you wish.  Do not swallow any toothpaste of mouthwash.     _X__ 4.  No Alcohol for 24 hours before or after surgery.   _X__ 5.  Do Not Smoke or use e-cigarettes For 24 Hours Prior to Your Surgery.                 Do not use any chewable tobacco products for at least 6 hours prior to                 Surgery.  _X__  6.  Do not use any recreational drugs (marijuana, cocaine, heroin, ecstasy, MDMA or other)                For at least one week prior to your surgery.  Combination of these drugs with  anesthesia                May have life threatening results.  __X__  7.  Notify your doctor if there is any change in your medical condition      (cold, fever, infections).     Do not wear jewelry, make-up, hairpins, clips or nail polish. Do not wear lotions, powders, or perfumes. You may wear deodorant. Do not shave 48 hours prior to surgery. Men may shave face and neck. Do not bring valuables to the hospital.    [facility] is not responsible for any belongings or valuables.  Contacts, dentures or bridgework may not be worn into surgery. Leave your suitcase in the car. After  surgery it may be brought to your room. For patients admitted to the hospital, discharge time is determined by your treatment team.   Patients discharged the day of surgery will not be allowed to drive home.   Make arrangements for someone to be with you for the first 24 hours of your Same Day Discharge.    Please read over the following fact sheets that you were given:   Total Joint Packet    __X__ Take these medicines the morning of surgery with A SIP OF WATER:    1. famotidine (PEPCID) 20 MG   2.   3.   4.  5.  6.  ____ Fleet Enema (as directed)   __X__ Use CHG Soap (or wipes) as directed  ____ Use Benzoyl Peroxide Gel as instructed  ____ Use inhalers on the day of surgery  ____ Stop metformin 2 days prior to surgery    ____ Take 1/2 of usual insulin dose the night before surgery. No insulin the morning          of surgery.   __X__ Talk with your doctor at tomorrow's appointment about when stopping your aspirin 81 mg.  __X__ One Week prior to surgery- Stop Anti-inflammatories such as Ibuprofen, Aleve, Advil, Motrin, meloxicam (MOBIC), diclofenac, etodolac, ketorolac, Toradol, Daypro, piroxicam, Goody's or BC powders. OK TO USE TYLENOL IF NEEDED   __X__ Stop supplements until after surgery.    ____ Bring C-Pap to the hospital.    If you have any questions regarding your pre-procedure  instructions,  Please call Pre-admit Testing at [phone].

## 2021-08-06 DIAGNOSIS — M1712 Unilateral primary osteoarthritis, left knee: Secondary | ICD-10-CM | POA: Diagnosis not present

## 2021-08-06 LAB — TYPE AND SCREEN
ABO/RH(D): A POS
Antibody Screen: NEGATIVE

## 2021-08-06 LAB — URINE CULTURE
Culture: 40000 — AB
Special Requests: NORMAL

## 2021-08-12 ENCOUNTER — Other Ambulatory Visit
Admission: RE | Admit: 2021-08-12 | Discharge: 2021-08-12 | Disposition: A | Discharge status: Home / Self Care | Payer: PPO | Source: Ambulatory Visit | Attending: Orthopedic Surgery | Admitting: Orthopedic Surgery

## 2021-08-12 ENCOUNTER — Other Ambulatory Visit: Payer: Self-pay

## 2021-08-12 DIAGNOSIS — K219 Gastro-esophageal reflux disease without esophagitis: Secondary | ICD-10-CM | POA: Diagnosis present

## 2021-08-12 DIAGNOSIS — M1712 Unilateral primary osteoarthritis, left knee: Secondary | ICD-10-CM | POA: Diagnosis present

## 2021-08-12 DIAGNOSIS — Z471 Aftercare following joint replacement surgery: Secondary | ICD-10-CM | POA: Diagnosis not present

## 2021-08-12 DIAGNOSIS — M1711 Unilateral primary osteoarthritis, right knee: Secondary | ICD-10-CM | POA: Diagnosis not present

## 2021-08-12 DIAGNOSIS — Z7982 Long term (current) use of aspirin: Secondary | ICD-10-CM | POA: Diagnosis not present

## 2021-08-12 DIAGNOSIS — Q796 Ehlers-Danlos syndrome, unspecified: Secondary | ICD-10-CM | POA: Diagnosis not present

## 2021-08-12 DIAGNOSIS — Z01812 Encounter for preprocedural laboratory examination: Secondary | ICD-10-CM | POA: Insufficient documentation

## 2021-08-12 DIAGNOSIS — Z96651 Presence of right artificial knee joint: Secondary | ICD-10-CM | POA: Diagnosis present

## 2021-08-12 DIAGNOSIS — Z20822 Contact with and (suspected) exposure to covid-19: Secondary | ICD-10-CM | POA: Diagnosis present

## 2021-08-12 DIAGNOSIS — Z96652 Presence of left artificial knee joint: Secondary | ICD-10-CM | POA: Diagnosis not present

## 2021-08-12 DIAGNOSIS — Z79899 Other long term (current) drug therapy: Secondary | ICD-10-CM | POA: Diagnosis not present

## 2021-08-12 DIAGNOSIS — R918 Other nonspecific abnormal finding of lung field: Secondary | ICD-10-CM | POA: Diagnosis present

## 2021-08-12 DIAGNOSIS — H409 Unspecified glaucoma: Secondary | ICD-10-CM | POA: Diagnosis present

## 2021-08-13 LAB — SARS CORONAVIRUS 2 (TAT 6-24 HRS): SARS Coronavirus 2: NEGATIVE

## 2021-08-13 MED ORDER — CEFAZOLIN SODIUM-DEXTROSE 2-4 GM/100ML-% IV SOLN
2.0000 g | INTRAVENOUS | Status: AC
  Administered 2021-08-14: 2 g via INTRAVENOUS

## 2021-08-13 NOTE — Anesthesia Preprocedure Evaluation (Addendum)
Anesthesia Evaluation  Patient identified by MRN, date of birth, ID band Patient awake    Reviewed: Allergy & Precautions, NPO status , Patient's Chart, lab work & pertinent test results  History of Anesthesia Complications Negative for: history of anesthetic complications  Airway Mallampati: III       Dental  (+) Teeth Intact, Dental Advidsory Given   Pulmonary neg pulmonary ROS,    Pulmonary exam normal breath sounds clear to auscultation       Cardiovascular negative cardio ROS Normal cardiovascular exam Rhythm:Regular Rate:Normal     Neuro/Psych negative neurological ROS  negative psych ROS   GI/Hepatic Neg liver ROS, GERD  Medicated and Controlled,  Endo/Other  negative endocrine ROS  Renal/GU Renal disease (kidney stone)  negative genitourinary   Musculoskeletal  (+) Arthritis , Osteoarthritis,    Abdominal (+) + obese,   Peds negative pediatric ROS (+)  Hematology negative hematology ROS (+)   Anesthesia Other Findings Past Medical History: No date: Bruises easily No date: Chicken pox No date: Ehlers-Danlos syndrome - non vascular type (patient states that her doctor told her that she had the least severe type) No date: GERD (gastroesophageal reflux disease) No date: Glaucoma No date: Hammer toe No date: Multiple lung nodules on CT No date: Swelling   Reproductive/Obstetrics negative OB ROS                            Anesthesia Physical  Anesthesia Plan  ASA: III  Anesthesia Plan: Spinal   Post-op Pain Management:    Induction: Intravenous  PONV Risk Score and Plan: 3 and TIVA and Propofol infusion  Airway Management Planned: Natural Airway and Simple Face Mask  Additional Equipment:   Intra-op Plan:   Post-operative Plan:   Informed Consent: I have reviewed the patients History and Physical, chart, labs and discussed the procedure including the risks,  benefits and alternatives for the proposed anesthesia with the patient or authorized representative who has indicated his/her understanding and acceptance.     Dental advisory given  Plan Discussed with: CRNA, Anesthesiologist and Surgeon  Anesthesia Plan Comments:        Anesthesia Quick Evaluation

## 2021-08-14 ENCOUNTER — Inpatient Hospital Stay: Payer: PPO

## 2021-08-14 ENCOUNTER — Encounter
Admission: RE | Disposition: A | Discharge status: Home / Self Care | Payer: Self-pay | Source: Home / Self Care | Attending: Orthopedic Surgery

## 2021-08-14 ENCOUNTER — Other Ambulatory Visit: Payer: Self-pay

## 2021-08-14 ENCOUNTER — Inpatient Hospital Stay
Admission: RE | Admit: 2021-08-14 | Discharge: 2021-08-15 | DRG: 470 | Disposition: A | Discharge status: Home Health | Payer: PPO | Attending: Orthopedic Surgery | Admitting: Orthopedic Surgery

## 2021-08-14 ENCOUNTER — Encounter: Payer: Self-pay | Admitting: Orthopedic Surgery

## 2021-08-14 ENCOUNTER — Inpatient Hospital Stay: Payer: PPO | Admitting: Urgent Care

## 2021-08-14 ENCOUNTER — Inpatient Hospital Stay: Payer: PPO | Admitting: Anesthesiology

## 2021-08-14 DIAGNOSIS — Z7982 Long term (current) use of aspirin: Secondary | ICD-10-CM | POA: Diagnosis not present

## 2021-08-14 DIAGNOSIS — R918 Other nonspecific abnormal finding of lung field: Secondary | ICD-10-CM | POA: Diagnosis present

## 2021-08-14 DIAGNOSIS — Z79899 Other long term (current) drug therapy: Secondary | ICD-10-CM

## 2021-08-14 DIAGNOSIS — Z96659 Presence of unspecified artificial knee joint: Secondary | ICD-10-CM

## 2021-08-14 DIAGNOSIS — K219 Gastro-esophageal reflux disease without esophagitis: Secondary | ICD-10-CM | POA: Diagnosis present

## 2021-08-14 DIAGNOSIS — M1712 Unilateral primary osteoarthritis, left knee: Secondary | ICD-10-CM | POA: Diagnosis present

## 2021-08-14 DIAGNOSIS — Q796 Ehlers-Danlos syndrome, unspecified: Secondary | ICD-10-CM | POA: Diagnosis not present

## 2021-08-14 DIAGNOSIS — Z20822 Contact with and (suspected) exposure to covid-19: Secondary | ICD-10-CM | POA: Diagnosis present

## 2021-08-14 DIAGNOSIS — Z96651 Presence of right artificial knee joint: Secondary | ICD-10-CM | POA: Diagnosis present

## 2021-08-14 DIAGNOSIS — H409 Unspecified glaucoma: Secondary | ICD-10-CM | POA: Diagnosis present

## 2021-08-14 HISTORY — PX: KNEE ARTHROPLASTY: SHX992

## 2021-08-14 SURGERY — ARTHROPLASTY, KNEE, TOTAL, USING IMAGELESS COMPUTER-ASSISTED NAVIGATION
Anesthesia: Spinal | Site: Knee | Laterality: Left

## 2021-08-14 MED ORDER — ACETAMINOPHEN 10 MG/ML IV SOLN
1000.0000 mg | Freq: Four times a day (QID) | INTRAVENOUS | Status: AC
  Administered 2021-08-14 – 2021-08-15 (×4): 1000 mg via INTRAVENOUS
  Filled 2021-08-14 (×5): qty 100

## 2021-08-14 MED ORDER — FENTANYL CITRATE (PF) 100 MCG/2ML IJ SOLN
INTRAMUSCULAR | Status: AC
  Filled 2021-08-14: qty 2

## 2021-08-14 MED ORDER — TRAMADOL HCL 50 MG PO TABS
50.0000 mg | ORAL_TABLET | ORAL | Status: DC | PRN
  Administered 2021-08-15 (×2): 100 mg via ORAL
  Filled 2021-08-14 (×2): qty 2

## 2021-08-14 MED ORDER — CELECOXIB 200 MG PO CAPS
ORAL_CAPSULE | ORAL | Status: AC
  Administered 2021-08-14: 400 mg via ORAL
  Filled 2021-08-14: qty 2

## 2021-08-14 MED ORDER — KETAMINE HCL 50 MG/5ML IJ SOSY
PREFILLED_SYRINGE | INTRAMUSCULAR | Status: AC
  Filled 2021-08-14: qty 5

## 2021-08-14 MED ORDER — CHLORHEXIDINE GLUCONATE 0.12 % MT SOLN: 15.0000 mL | Freq: Once | OROMUCOSAL | Status: AC

## 2021-08-14 MED ORDER — GABAPENTIN 300 MG PO CAPS: 300.0000 mg | ORAL_CAPSULE | Freq: Once | ORAL | Status: AC

## 2021-08-14 MED ORDER — SODIUM CHLORIDE 0.9 % IV SOLN
INTRAVENOUS | Status: DC | PRN
  Administered 2021-08-14: 20 ug/min via INTRAVENOUS

## 2021-08-14 MED ORDER — OXYCODONE HCL 5 MG PO TABS: 5.0000 mg | ORAL_TABLET | ORAL | Status: DC | PRN

## 2021-08-14 MED ORDER — DEXAMETHASONE SODIUM PHOSPHATE 10 MG/ML IJ SOLN
INTRAMUSCULAR | Status: AC
  Administered 2021-08-14: 8 mg via INTRAVENOUS
  Filled 2021-08-14: qty 1

## 2021-08-14 MED ORDER — ACETAMINOPHEN 10 MG/ML IV SOLN: 1000.0000 mg | Freq: Once | INTRAVENOUS | Status: DC | PRN

## 2021-08-14 MED ORDER — TRANEXAMIC ACID-NACL 1000-0.7 MG/100ML-% IV SOLN
1000.0000 mg | INTRAVENOUS | Status: AC
  Administered 2021-08-14: 1000 mg via INTRAVENOUS

## 2021-08-14 MED ORDER — STERILE WATER FOR IRRIGATION IR SOLN
Status: DC | PRN
  Administered 2021-08-14: 1000 mL

## 2021-08-14 MED ORDER — DEXAMETHASONE SODIUM PHOSPHATE 10 MG/ML IJ SOLN: 8.0000 mg | Freq: Once | INTRAMUSCULAR | Status: AC

## 2021-08-14 MED ORDER — LIDOCAINE HCL (PF) 1 % IJ SOLN
INTRAMUSCULAR | Status: DC | PRN
  Administered 2021-08-14: 2 mL via SUBCUTANEOUS

## 2021-08-14 MED ORDER — OXYCODONE HCL 5 MG PO TABS: 5.0000 mg | ORAL_TABLET | Freq: Once | ORAL | Status: DC | PRN

## 2021-08-14 MED ORDER — LACTATED RINGERS IV SOLN: INTRAVENOUS | Status: DC

## 2021-08-14 MED ORDER — ONDANSETRON HCL 4 MG/2ML IJ SOLN: 4.0000 mg | Freq: Four times a day (QID) | INTRAMUSCULAR | Status: DC | PRN

## 2021-08-14 MED ORDER — METOCLOPRAMIDE HCL 10 MG PO TABS
10.0000 mg | ORAL_TABLET | Freq: Three times a day (TID) | ORAL | Status: DC
Start: 2021-08-14 — End: 2021-08-15
  Administered 2021-08-14 – 2021-08-15 (×3): 10 mg via ORAL
  Filled 2021-08-14 (×3): qty 1

## 2021-08-14 MED ORDER — BUPIVACAINE HCL (PF) 0.5 % IJ SOLN
INTRAMUSCULAR | Status: DC | PRN
  Administered 2021-08-14: 3 mL

## 2021-08-14 MED ORDER — BRIMONIDINE TARTRATE 0.2 % OP SOLN
1.0000 [drp] | Freq: Two times a day (BID) | OPHTHALMIC | Status: DC
  Administered 2021-08-14 – 2021-08-15 (×2): 1 [drp] via OPHTHALMIC
  Filled 2021-08-14: qty 5

## 2021-08-14 MED ORDER — ONDANSETRON HCL 4 MG PO TABS: 4.0000 mg | ORAL_TABLET | Freq: Four times a day (QID) | ORAL | Status: DC | PRN

## 2021-08-14 MED ORDER — ALUM & MAG HYDROXIDE-SIMETH 200-200-20 MG/5ML PO SUSP
30.0000 mL | ORAL | Status: DC | PRN
Start: 2021-08-14 — End: 2021-08-15

## 2021-08-14 MED ORDER — GABAPENTIN 300 MG PO CAPS
ORAL_CAPSULE | ORAL | Status: AC
  Administered 2021-08-14: 300 mg via ORAL
  Filled 2021-08-14: qty 1

## 2021-08-14 MED ORDER — LATANOPROST 0.005 % OP SOLN
1.0000 [drp] | Freq: Every day | OPHTHALMIC | Status: DC
  Administered 2021-08-14: 1 [drp] via OPHTHALMIC
  Filled 2021-08-14: qty 2.5

## 2021-08-14 MED ORDER — TIMOLOL MALEATE 0.5 % OP SOLN
1.0000 [drp] | Freq: Two times a day (BID) | OPHTHALMIC | Status: DC
  Administered 2021-08-14 – 2021-08-15 (×2): 1 [drp] via OPHTHALMIC
  Filled 2021-08-14: qty 5

## 2021-08-14 MED ORDER — ARTIFICIAL TEARS OPHTHALMIC OINT
TOPICAL_OINTMENT | OPHTHALMIC | Status: DC | PRN
  Administered 2021-08-14: 1 via OPHTHALMIC

## 2021-08-14 MED ORDER — ONDANSETRON HCL 4 MG/2ML IJ SOLN
INTRAMUSCULAR | Status: AC
  Filled 2021-08-14: qty 2

## 2021-08-14 MED ORDER — PROPOFOL 1000 MG/100ML IV EMUL
INTRAVENOUS | Status: AC
  Filled 2021-08-14: qty 100

## 2021-08-14 MED ORDER — PROMETHAZINE HCL 25 MG/ML IJ SOLN
6.2500 mg | INTRAMUSCULAR | Status: DC | PRN
Start: 2021-08-14 — End: 2021-08-14

## 2021-08-14 MED ORDER — BISACODYL 10 MG RE SUPP: 10.0000 mg | Freq: Every day | RECTAL | Status: DC | PRN

## 2021-08-14 MED ORDER — BUPIVACAINE HCL (PF) 0.25 % IJ SOLN
INTRAMUSCULAR | Status: AC
  Filled 2021-08-14: qty 60

## 2021-08-14 MED ORDER — DIPHENHYDRAMINE HCL 12.5 MG/5ML PO ELIX: 12.5000 mg | ORAL_SOLUTION | ORAL | Status: DC | PRN

## 2021-08-14 MED ORDER — CELECOXIB 200 MG PO CAPS: 400.0000 mg | ORAL_CAPSULE | Freq: Once | ORAL | Status: AC

## 2021-08-14 MED ORDER — DEXAMETHASONE SODIUM PHOSPHATE 10 MG/ML IJ SOLN
INTRAMUSCULAR | Status: AC
  Filled 2021-08-14: qty 1

## 2021-08-14 MED ORDER — CEFAZOLIN SODIUM-DEXTROSE 2-4 GM/100ML-% IV SOLN
INTRAVENOUS | Status: AC
  Filled 2021-08-14: qty 100

## 2021-08-14 MED ORDER — FENTANYL CITRATE (PF) 100 MCG/2ML IJ SOLN
INTRAMUSCULAR | Status: DC | PRN
  Administered 2021-08-14: 25 ug via INTRAVENOUS

## 2021-08-14 MED ORDER — CHLORHEXIDINE GLUCONATE 4 % EX LIQD: 60.0000 mL | Freq: Once | CUTANEOUS | Status: DC

## 2021-08-14 MED ORDER — PANTOPRAZOLE SODIUM 40 MG PO TBEC
40.0000 mg | DELAYED_RELEASE_TABLET | Freq: Two times a day (BID) | ORAL | Status: DC
  Administered 2021-08-14 – 2021-08-15 (×2): 40 mg via ORAL
  Filled 2021-08-14 (×2): qty 1

## 2021-08-14 MED ORDER — FENTANYL CITRATE (PF) 100 MCG/2ML IJ SOLN: 25.0000 ug | INTRAMUSCULAR | Status: DC | PRN

## 2021-08-14 MED ORDER — TRANEXAMIC ACID-NACL 1000-0.7 MG/100ML-% IV SOLN: 1000.0000 mg | Freq: Once | INTRAVENOUS | Status: AC

## 2021-08-14 MED ORDER — FOLIC ACID 1 MG PO TABS
1.0000 mg | ORAL_TABLET | ORAL | Status: DC
  Administered 2021-08-15: 1 mg via ORAL

## 2021-08-14 MED ORDER — SODIUM CHLORIDE 0.9 % IV SOLN: INTRAVENOUS | Status: DC

## 2021-08-14 MED ORDER — MAGNESIUM HYDROXIDE 400 MG/5ML PO SUSP
30.0000 mL | Freq: Every day | ORAL | Status: DC
  Administered 2021-08-14: 30 mL via ORAL
  Filled 2021-08-14 (×2): qty 30

## 2021-08-14 MED ORDER — KETAMINE HCL 10 MG/ML IJ SOLN
INTRAMUSCULAR | Status: DC | PRN
  Administered 2021-08-14: 30 mg via INTRAVENOUS

## 2021-08-14 MED ORDER — CEFAZOLIN SODIUM-DEXTROSE 2-4 GM/100ML-% IV SOLN
2.0000 g | Freq: Four times a day (QID) | INTRAVENOUS | Status: AC
  Administered 2021-08-14 – 2021-08-15 (×2): 2 g via INTRAVENOUS
  Filled 2021-08-14 (×2): qty 100

## 2021-08-14 MED ORDER — PROPOFOL 10 MG/ML IV BOLUS
INTRAVENOUS | Status: DC | PRN
Start: 2021-08-14 — End: 2021-08-14
  Administered 2021-08-14: 50 mg via INTRAVENOUS

## 2021-08-14 MED ORDER — SURGIPHOR WOUND IRRIGATION SYSTEM - OPTIME
TOPICAL | Status: DC | PRN
  Administered 2021-08-14: 1 via TOPICAL

## 2021-08-14 MED ORDER — HYDROMORPHONE HCL 1 MG/ML IJ SOLN
0.5000 mg | INTRAMUSCULAR | Status: DC | PRN
  Administered 2021-08-14: 1 mg via INTRAVENOUS
  Filled 2021-08-14: qty 1

## 2021-08-14 MED ORDER — ACETAMINOPHEN 10 MG/ML IV SOLN
INTRAVENOUS | Status: DC | PRN
  Administered 2021-08-14: 1000 mg via INTRAVENOUS

## 2021-08-14 MED ORDER — ENOXAPARIN SODIUM 30 MG/0.3ML IJ SOSY
30.0000 mg | PREFILLED_SYRINGE | Freq: Two times a day (BID) | INTRAMUSCULAR | Status: DC
  Administered 2021-08-15: 30 mg via SUBCUTANEOUS
  Filled 2021-08-14: qty 0.3

## 2021-08-14 MED ORDER — SENNOSIDES-DOCUSATE SODIUM 8.6-50 MG PO TABS
1.0000 | ORAL_TABLET | Freq: Two times a day (BID) | ORAL | Status: DC
  Administered 2021-08-14 – 2021-08-15 (×2): 1 via ORAL
  Filled 2021-08-14 (×2): qty 1

## 2021-08-14 MED ORDER — MIDAZOLAM HCL 2 MG/2ML IJ SOLN
INTRAMUSCULAR | Status: DC | PRN
  Administered 2021-08-14 (×2): 1 mg via INTRAVENOUS

## 2021-08-14 MED ORDER — 0.9 % SODIUM CHLORIDE (POUR BTL) OPTIME
TOPICAL | Status: DC | PRN
  Administered 2021-08-14: 500 mL

## 2021-08-14 MED ORDER — FERROUS SULFATE 325 (65 FE) MG PO TABS
325.0000 mg | ORAL_TABLET | Freq: Two times a day (BID) | ORAL | Status: DC
  Administered 2021-08-15: 325 mg via ORAL
  Filled 2021-08-14: qty 1

## 2021-08-14 MED ORDER — OXYCODONE HCL 5 MG PO TABS
10.0000 mg | ORAL_TABLET | ORAL | Status: DC | PRN
  Administered 2021-08-14: 10 mg via ORAL
  Filled 2021-08-14: qty 2

## 2021-08-14 MED ORDER — SODIUM CHLORIDE (PF) 0.9 % IJ SOLN
INTRAMUSCULAR | Status: DC | PRN
  Administered 2021-08-14: 120 mL

## 2021-08-14 MED ORDER — CHLORHEXIDINE GLUCONATE 0.12 % MT SOLN
OROMUCOSAL | Status: AC
  Administered 2021-08-14: 15 mL via OROMUCOSAL
  Filled 2021-08-14: qty 15

## 2021-08-14 MED ORDER — TRANEXAMIC ACID-NACL 1000-0.7 MG/100ML-% IV SOLN
INTRAVENOUS | Status: AC
  Administered 2021-08-14: 1000 mg via INTRAVENOUS
  Filled 2021-08-14: qty 100

## 2021-08-14 MED ORDER — NEOMYCIN-POLYMYXIN B GU 40-200000 IR SOLN
Status: AC
  Filled 2021-08-14: qty 2

## 2021-08-14 MED ORDER — MIDAZOLAM HCL 2 MG/2ML IJ SOLN
INTRAMUSCULAR | Status: AC
  Filled 2021-08-14: qty 2

## 2021-08-14 MED ORDER — FLEET ENEMA 7-19 GM/118ML RE ENEM: 1.0000 | ENEMA | Freq: Once | RECTAL | Status: DC | PRN

## 2021-08-14 MED ORDER — CELECOXIB 200 MG PO CAPS
200.0000 mg | ORAL_CAPSULE | Freq: Two times a day (BID) | ORAL | Status: DC
  Administered 2021-08-14 – 2021-08-15 (×2): 200 mg via ORAL
  Filled 2021-08-14 (×2): qty 1

## 2021-08-14 MED ORDER — ACETAMINOPHEN 10 MG/ML IV SOLN
INTRAVENOUS | Status: AC
  Filled 2021-08-14: qty 100

## 2021-08-14 MED ORDER — ACETAMINOPHEN 325 MG PO TABS: 325.0000 mg | ORAL_TABLET | Freq: Four times a day (QID) | ORAL | Status: DC | PRN

## 2021-08-14 MED ORDER — SEVOFLURANE IN SOLN
RESPIRATORY_TRACT | Status: AC
  Filled 2021-08-14: qty 250

## 2021-08-14 MED ORDER — TRANEXAMIC ACID-NACL 1000-0.7 MG/100ML-% IV SOLN
INTRAVENOUS | Status: AC
  Filled 2021-08-14: qty 100

## 2021-08-14 MED ORDER — PROPOFOL 500 MG/50ML IV EMUL
INTRAVENOUS | Status: DC | PRN
  Administered 2021-08-14: 100 ug/kg/min via INTRAVENOUS

## 2021-08-14 MED ORDER — OXYCODONE HCL 5 MG/5ML PO SOLN: 5.0000 mg | Freq: Once | ORAL | Status: DC | PRN

## 2021-08-14 MED ORDER — ENSURE PRE-SURGERY PO LIQD
296.0000 mL | Freq: Once | ORAL | Status: AC
  Administered 2021-08-14: 296 mL via ORAL
  Filled 2021-08-14: qty 296

## 2021-08-14 MED ORDER — BUPIVACAINE HCL (PF) 0.5 % IJ SOLN
INTRAMUSCULAR | Status: AC
  Filled 2021-08-14: qty 10

## 2021-08-14 MED ORDER — PHENOL 1.4 % MT LIQD
1.0000 | OROMUCOSAL | Status: DC | PRN
  Filled 2021-08-14: qty 177

## 2021-08-14 MED ORDER — DEXAMETHASONE SODIUM PHOSPHATE 10 MG/ML IJ SOLN
INTRAMUSCULAR | Status: DC | PRN
  Administered 2021-08-14: 10 mg via INTRAVENOUS

## 2021-08-14 MED ORDER — ADULT MULTIVITAMIN W/MINERALS CH
1.0000 | ORAL_TABLET | Freq: Every day | ORAL | Status: DC
  Administered 2021-08-14 – 2021-08-15 (×2): 1 via ORAL
  Filled 2021-08-14 (×2): qty 1

## 2021-08-14 MED ORDER — BUPIVACAINE LIPOSOME 1.3 % IJ SUSP
INTRAMUSCULAR | Status: AC
  Filled 2021-08-14: qty 20

## 2021-08-14 MED ORDER — MENTHOL 3 MG MT LOZG
1.0000 | LOZENGE | OROMUCOSAL | Status: DC | PRN
  Filled 2021-08-14: qty 9

## 2021-08-14 MED ORDER — SODIUM CHLORIDE 0.9 % IR SOLN
Status: DC | PRN
  Administered 2021-08-14: 3000 mL

## 2021-08-14 MED ORDER — ORAL CARE MOUTH RINSE: 15.0000 mL | Freq: Once | OROMUCOSAL | Status: AC

## 2021-08-14 MED ORDER — SODIUM CHLORIDE FLUSH 0.9 % IV SOLN
INTRAVENOUS | Status: AC
  Filled 2021-08-14: qty 40

## 2021-08-14 SURGICAL SUPPLY — 74 items
ATTUNE PSFEM LTSZ5 NARCEM KNEE (Femur) ×1 IMPLANT
ATTUNE PSRP INSR SZ5 5 KNEE (Insert) ×1 IMPLANT
BASEPLATE TIBIAL ROTATING SZ 4 (Knees) ×1 IMPLANT
BATTERY INSTRU NAVIGATION (MISCELLANEOUS) ×8 IMPLANT
BLADE SAW 70X12.5 (BLADE) ×2 IMPLANT
BLADE SAW 90X13X1.19 OSCILLAT (BLADE) ×2 IMPLANT
BLADE SAW 90X25X1.19 OSCILLAT (BLADE) ×2 IMPLANT
CEMENT HV SMART SET (Cement) ×2 IMPLANT
COOLER POLAR GLACIER W/PUMP (MISCELLANEOUS) ×2 IMPLANT
CUFF TOURN SGL QUICK 24 (TOURNIQUET CUFF) ×1
CUFF TOURN SGL QUICK 34 (TOURNIQUET CUFF)
CUFF TRNQT CYL 24X4X16.5-23 (TOURNIQUET CUFF) IMPLANT
CUFF TRNQT CYL 34X4.125X (TOURNIQUET CUFF) IMPLANT
DRAPE 3/4 80X56 (DRAPES) ×2 IMPLANT
DRAPE INCISE IOBAN 66X45 STRL (DRAPES) ×1 IMPLANT
DRSG DERMACEA 8X12 NADH (GAUZE/BANDAGES/DRESSINGS) ×2 IMPLANT
DRSG MEPILEX SACRM 8.7X9.8 (GAUZE/BANDAGES/DRESSINGS) ×2 IMPLANT
DRSG OPSITE POSTOP 4X14 (GAUZE/BANDAGES/DRESSINGS) ×2 IMPLANT
DRSG TEGADERM 4X4.75 (GAUZE/BANDAGES/DRESSINGS) ×2 IMPLANT
DURAPREP 26ML APPLICATOR (WOUND CARE) ×4 IMPLANT
ELECT CAUTERY BLADE 6.4 (BLADE) ×2 IMPLANT
ELECT REM PT RETURN 9FT ADLT (ELECTROSURGICAL) ×2
ELECTRODE REM PT RTRN 9FT ADLT (ELECTROSURGICAL) ×1 IMPLANT
EX-PIN ORTHOLOCK NAV 4X150 (PIN) ×4 IMPLANT
GAUZE 4X4 16PLY ~~LOC~~+RFID DBL (SPONGE) ×2 IMPLANT
GLOVE SURG ENC MOIS LTX SZ7.5 (GLOVE) ×4 IMPLANT
GLOVE SURG ENC TEXT LTX SZ7.5 (GLOVE) ×5 IMPLANT
GLOVE SURG UNDER LTX SZ8 (GLOVE) ×2 IMPLANT
GLOVE SURG UNDER POLY LF SZ7.5 (GLOVE) ×2 IMPLANT
GOWN STRL REUS W/ TWL LRG LVL3 (GOWN DISPOSABLE) ×2 IMPLANT
GOWN STRL REUS W/ TWL XL LVL3 (GOWN DISPOSABLE) ×1 IMPLANT
GOWN STRL REUS W/TWL LRG LVL3 (GOWN DISPOSABLE) ×2
GOWN STRL REUS W/TWL XL LVL3 (GOWN DISPOSABLE) ×1
HEMOVAC 400CC 10FR (MISCELLANEOUS) ×2 IMPLANT
HOLDER FOLEY CATH W/STRAP (MISCELLANEOUS) ×2 IMPLANT
HOOD PEEL AWAY FLYTE STAYCOOL (MISCELLANEOUS) ×4 IMPLANT
IRRIGATION SURGIPHOR STRL (IV SOLUTION) ×2 IMPLANT
IV NS IRRIG 3000ML ARTHROMATIC (IV SOLUTION) ×2 IMPLANT
KIT TURNOVER KIT A (KITS) ×2 IMPLANT
KNIFE SCULPS 14X20 (INSTRUMENTS) ×2 IMPLANT
LABEL OR SOLS (LABEL) ×1 IMPLANT
MANIFOLD NEPTUNE II (INSTRUMENTS) ×4 IMPLANT
NDL SAFETY ECLIPSE 18X1.5 (NEEDLE) ×1 IMPLANT
NDL SPNL 20GX3.5 QUINCKE YW (NEEDLE) ×2 IMPLANT
NEEDLE HYPO 18GX1.5 SHARP (NEEDLE)
NEEDLE SPNL 20GX3.5 QUINCKE YW (NEEDLE) ×4 IMPLANT
NS IRRIG 500ML POUR BTL (IV SOLUTION) ×2 IMPLANT
PACK TOTAL KNEE (MISCELLANEOUS) ×2 IMPLANT
PAD WRAPON POLAR KNEE (MISCELLANEOUS) ×1 IMPLANT
PATELLA MEDIAL ATTUN 35MM KNEE (Knees) ×1 IMPLANT
PENCIL SMOKE EVACUATOR COATED (MISCELLANEOUS) ×2 IMPLANT
PIN DRILL FIX HALF THREAD (BIT) ×3 IMPLANT
PIN FIXATION 1/8DIA X 3INL (PIN) ×2 IMPLANT
PULSAVAC PLUS IRRIG FAN TIP (DISPOSABLE) ×2
SOL PREP PVP 2OZ (MISCELLANEOUS) ×2
SOLUTION PREP PVP 2OZ (MISCELLANEOUS) ×1 IMPLANT
SPONGE DRAIN TRACH 4X4 STRL 2S (GAUZE/BANDAGES/DRESSINGS) ×2 IMPLANT
SPONGE T-LAP 18X18 ~~LOC~~+RFID (SPONGE) ×6 IMPLANT
STAPLER SKIN PROX 35W (STAPLE) ×2 IMPLANT
STOCKINETTE IMPERV 14X48 (MISCELLANEOUS) IMPLANT
STRAP TIBIA SHORT (MISCELLANEOUS) ×2 IMPLANT
SUCTION FRAZIER HANDLE 10FR (MISCELLANEOUS) ×1
SUCTION TUBE FRAZIER 10FR DISP (MISCELLANEOUS) ×1 IMPLANT
SUT VIC AB 0 CT1 36 (SUTURE) ×4 IMPLANT
SUT VIC AB 1 CT1 36 (SUTURE) ×4 IMPLANT
SUT VIC AB 2-0 CT2 27 (SUTURE) ×3 IMPLANT
SYR 20ML LL LF (SYRINGE) ×1 IMPLANT
SYR 30ML LL (SYRINGE) ×4 IMPLANT
TIP FAN IRRIG PULSAVAC PLUS (DISPOSABLE) ×1 IMPLANT
TOWEL OR 17X26 4PK STRL BLUE (TOWEL DISPOSABLE) ×2 IMPLANT
TOWER CARTRIDGE SMART MIX (DISPOSABLE) ×2 IMPLANT
TRAY FOLEY MTR SLVR 16FR STAT (SET/KITS/TRAYS/PACK) ×2 IMPLANT
WATER STERILE IRR 1000ML POUR (IV SOLUTION) ×1 IMPLANT
WRAPON POLAR PAD KNEE (MISCELLANEOUS) ×2

## 2021-08-14 NOTE — Plan of Care (Signed)

## 2021-08-14 NOTE — H&P (Signed)
The patient has been re-examined, and the chart reviewed, and there have been no interval changes to the documented history and physical.    The risks, benefits, and alternatives have been discussed at length. The patient expressed understanding of the risks benefits and agreed with plans for surgical intervention.  Angle Karel P. Caralyn Twining, Jr. M.D.    

## 2021-08-14 NOTE — Transfer of Care (Signed)
Immediate Anesthesia Transfer of Care Note  Patient: Victoria Li  Procedure(s) Performed: COMPUTER ASSISTED TOTAL KNEE ARTHROPLASTY (Left: Knee)  Patient Location: PACU  Anesthesia Type:Spinal  Level of Consciousness: awake, alert  and oriented  Airway & Oxygen Therapy: Patient Spontanous Breathing and Patient connected to face mask oxygen  Post-op Assessment: Report given to RN and Post -op Vital signs reviewed and stable  Post vital signs: Reviewed and stable  Last Vitals:  Vitals Value Taken Time  BP 129/80 08/14/21 1911  Temp    Pulse 99 08/14/21 1911  Resp 13 08/14/21 1911  SpO2 88 % 08/14/21 1911  Vitals shown include unvalidated device data.  Last Pain:  Vitals:   08/14/21 1103  TempSrc: Oral  PainSc: 0-No pain         Complications: No notable events documented.

## 2021-08-14 NOTE — H&P (Signed)
ORTHOPAEDIC HISTORY & PHYSICAL Progress Notes - documented in this encounter Table of Contents for Progress Notes  Gwenlyn Fudge, PA - 08/06/2021 2:45 PM EDT  Rush Landmark, [place] - 08/06/2021 2:45 PM EDT    Gwenlyn Fudge, PA - 08/06/2021 2:45 PM EDT Formatting of this note is different from the original. [place] [facility] Chief Complaint:   Chief Complaint  Patient presents with   Knee Pain  H & P LEFT KNEE   History of Present Illness:   Victoria Li is a 75 y.o. female that presents to clinic today for her preoperative history and evaluation. Patient presents unaccompanied. The patient is scheduled to undergo a left total knee arthroplasty on 08/14/21 by Dr. Marry Guan. Her pain began many years ago. The pain is located along the lateral aspect of the knee. She describes her pain as worse with weightbearing. She reports associated valgus deformity which has worsened over time. She denies associated numbness or tingling.   The patient's symptoms have progressed to the point that they decrease her quality of life. The patient has previously undergone conservative treatment including NSAIDS and injections to the knee without adequate control of her symptoms.  Of note, patient has a history of Ehlers-Danlos syndrome.    Denies history of blood clots, significant cardiac history, or lumbar surgery.  Past Medical, Surgical, Family, Social History, Allergies, Medications:   Past Medical History:  Past Medical History:  Diagnosis Date   Chicken pox   Ehlers-Danlos syndrome   Glaucoma (increased eye pressure)   Hammer toe   Multiple lung nodules on CT   PMB (postmenopausal bleeding)   Past Surgical History:  Past Surgical History:  Procedure Laterality Date   BREAST MASS REMOVED Left 2002   COLONOSCOPY 04/18/2015  Hyperplastic sigmoid polyps removed.   COLONOSCOPY 08/31/2020  Negative Colon Biopsy/FHx CC/Repeat 56yr/JWB    DILATION AND CURETTAGE OF UTERUS   HYSTEROSCOPY   Right total knee arthroplasty using computer-assisted navigation 03/27/2021  Dr HMarry Guan  SCAR TISSUE REMOVED MULTIPLE TIMES   TOES REMOVED Bilateral 2014   Current Medications:  Current Outpatient Medications  Medication Sig Dispense Refill   acetaminophen (TYLENOL) 500 MG tablet Take 1,000 mg by mouth every 4 (four) hours as needed   acetaminophen (TYLENOL) 650 MG ER tablet Take 650 mg by mouth every 8 (eight) hours as needed for Pain   aspirin 81 MG EC tablet Take 81 mg by mouth once daily   brimonidine (ALPHAGAN) 0.2 % ophthalmic solution 1 drop in both eyes 2 times a day 2   calcium carbonate-vitamin D3 (CALTRATE 600+D) 600 mg(1,'500mg'$ ) -200 unit tablet Take 1 tablet by mouth once daily.   cholecalciferol, vitamin D3, 25 mcg (1,000 unit) Chew Take 1 tablet by mouth once daily   docusate (COLACE) 100 MG capsule Take 100 mg by mouth once daily   famotidine (PEPCID) 10 MG tablet Take 10 mg by mouth nightly as needed   folic acid (FOLVITE) 1 MG tablet Take 1 mg by mouth once daily   L. reuteri/L. rhamnosus (REPHRESH PRO-B ORAL) Take 1 capsule by mouth once a week   latanoprost (XALATAN) 0.005 % ophthalmic solution Place 1 drop into both eyes nightly.   MULTIVIT WITH MINERALS/LUTEIN (MULTIVITAMIN 50 PLUS ORAL) Take 1 tablet by mouth once daily.   timolol hemihydrate (BETIMOL) 0.5 % ophthalmic solution Apply 1 drop to eye 2 (two) times daily   timolol maleate (TIMOPTIC) 0.5 % ophthalmic solution Place 1  drop into both eyes 2 (two) times daily   No current facility-administered medications for this visit.   Allergies: No Known Allergies  Social History:  Social History   Socioeconomic History   Marital status: Married  Spouse name: Shaqwana Stoakes   Number of children: 2   Years of education: 14+  Occupational History   Occupation: Retired  Tobacco Use   Smoking status: Never Smoker   Smokeless tobacco: Never Used  IT trainer Use: Never used  Substance and Sexual Activity   Alcohol use: Yes  Comment: occasionally   Drug use: No   Sexual activity: Yes  Partners: Male  Birth control/protection: Post-menopausal   Family History:  Family History  Problem Relation Age of Onset   Myocardial Infarction (Heart attack) Mother   Colon cancer Mother   Myocardial Infarction (Heart attack) Father   Stroke Father   Diabetes type II Father   Ehlers-Danlos syndrome Father   Ehlers-Danlos syndrome Son   Ehlers-Danlos syndrome Paternal Grandfather   Breast cancer Maternal Aunt   Breast cancer Paternal Aunt   Review of Systems:   A 10+ ROS was performed, reviewed, and the pertinent orthopaedic findings are documented in the HPI.   Physical Examination:   BP 130/80 (BP Location: Left forearm, Patient Position: Sitting, BP Cuff Size: Adult)  Ht 157.5 cm ('5\' 2"'$ )  Wt 95.8 kg (211 lb 3.2 oz)  LMP 12/30/1963  BMI 38.63 kg/m   Patient is a well-developed, well-nourished female in no acute distress. Patient has normal mood and affect. Patient is alert and oriented to person, place, and time.   HEENT: Atraumatic, normocephalic. Pupils equal and reactive to light. Extraocular motion intact. Noninjected sclera.  Cardiovascular: Regular rate and rhythm, with no murmurs, rubs, or gallops. Distal pulses palpable. No bruits.  Respiratory: Lungs clear to auscultation bilaterally.     Left Knee:  Soft tissue swelling: minimal Effusion: none Erythema: none Crepitance: mild Tenderness: lateral joint line Alignment: relative valgus Mediolateral laxity: lateral pseudolaxity Anterior drawer test:negative Lachman`s test: negative McMurray`s test: negative Atrophy: No significant atrophy.  Quadriceps tone was fair to good. Range of Motion: 2/0/135 degrees   Sensation intact over the saphenous, lateral sural cutaneous, superficial fibular, and deep fibular nerve distributions.  Tests Performed/Reviewed:   X-rays  Anteroposterior, lateral, and sunrise views of the left knee were obtained. Images reveal severe loss of lateral compartment joint space with significant osteophyte formation. Valgus alignment of the knee noted. No fractures or osseous abnormalities noted.  Personally ordered and interpreted today's x-rays.  Impression:   ICD-10-CM  1. Primary osteoarthritis of left knee M17.12   Plan:   The patient has end-stage degenerative changes of the left knee. It was explained to the patient that the condition is progressive in nature. Having failed conservative treatment, the patient has elected to proceed with a total joint arthroplasty. The patient will undergo a total joint arthroplasty with Dr. Marry Guan. The risks of surgery, including blood clot and infection, were discussed with the patient. Measures to reduce these risks, including the use of anticoagulation, perioperative antibiotics, and early ambulation were discussed. The importance of postoperative physical therapy was discussed with the patient. The patient elects to proceed with surgery. The patient is instructed to stop all blood thinners prior to surgery. The patient is instructed to call the hospital the day before surgery to learn of the proper arrival time.   Contact our office with any questions or concerns. Follow up as indicated,  or sooner should any new problems arise, if conditions worsen, or if they are otherwise concerned.   Gwenlyn Fudge, [facility] [place], [place] [address] Phone: [phone]  This note was generated in part with voice recognition software and I apologize for any typographical errors that were not detected and corrected.  Electronically signed by Gwenlyn Fudge, PA at 08/07/2021 2:51 PM EDT

## 2021-08-14 NOTE — Anesthesia Procedure Notes (Signed)
Spinal  Patient location during procedure: OR Start time: 08/14/2021 3:03 PM End time: 08/14/2021 3:07 PM Reason for block: surgical anesthesia Staffing Performed: resident/CRNA  Anesthesiologist: Iran Ouch, MD Resident/CRNA: Debe Coder, CRNA Preanesthetic Checklist Completed: patient identified, IV checked, site marked, risks and benefits discussed, surgical consent, monitors and equipment checked, pre-op evaluation and timeout performed Spinal Block Patient position: sitting Prep: DuraPrep Patient monitoring: heart rate, cardiac monitor, continuous pulse ox and blood pressure Approach: midline Location: L3-4 Injection technique: single-shot Needle Needle type: Pencan  Needle gauge: 24 G Needle length: 9 cm Assessment Sensory level: T4 Events: CSF return

## 2021-08-14 NOTE — Op Note (Signed)
OPERATIVE NOTE  DATE OF SURGERY:  08/14/2021  PATIENT NAME:  Henryetta Guastella Bero   DOB: [birth year]  MRN: [id number]  PRE-OPERATIVE DIAGNOSIS: Degenerative arthrosis of the left knee, primary  POST-OPERATIVE DIAGNOSIS:  Same  PROCEDURE:  Left total knee arthroplasty using computer-assisted navigation  SURGEON:  Marciano Sequin. M.D.  ASSISTANT: Cassell Smiles, PA-C (present and scrubbed throughout the case, critical for assistance with exposure, retraction, instrumentation, and closure)  ANESTHESIA: spinal  ESTIMATED BLOOD LOSS: 50 mL  FLUIDS REPLACED: 1400 mL of crystalloid  TOURNIQUET TIME: 96 minutes  DRAINS: 2 medium Hemovac drains  SOFT TISSUE RELEASES: Anterior cruciate ligament, posterior cruciate ligament, deep medial collateral ligament, patellofemoral ligament  IMPLANTS UTILIZED: DePuy Attune size 5N posterior stabilized femoral component (cemented), size 4 rotating platform tibial component (cemented), 35 mm medialized dome patella (cemented), and a 5 mm stabilized rotating platform polyethylene insert.  INDICATIONS FOR SURGERY: Stacee Gildner is a 75 y.o. year old female with a long history of progressive knee pain. X-rays demonstrated severe degenerative changes in tricompartmental fashion. The patient had not seen any significant improvement despite conservative nonsurgical intervention. After discussion of the risks and benefits of surgical intervention, the patient expressed understanding of the risks benefits and agree with plans for total knee arthroplasty.   The risks, benefits, and alternatives were discussed at length including but not limited to the risks of infection, bleeding, nerve injury, stiffness, blood clots, the need for revision surgery, cardiopulmonary complications, among others, and they were willing to proceed.  PROCEDURE IN DETAIL: The patient was brought into the operating room and, after adequate spinal anesthesia was achieved, a tourniquet  was placed on the patient's upper thigh. The patient's knee and leg were cleaned and prepped with alcohol and DuraPrep and draped in the usual sterile fashion. A "timeout" was performed as per usual protocol. The lower extremity was exsanguinated using an Esmarch, and the tourniquet was inflated to 300 mmHg. An anterior longitudinal incision was made followed by a standard mid vastus approach. The deep fibers of the medial collateral ligament were elevated in a subperiosteal fashion off of the medial flare of the tibia so as to maintain a continuous soft tissue sleeve. The patella was subluxed laterally and the patellofemoral ligament was incised. Inspection of the knee demonstrated severe degenerative changes with full-thickness loss of articular cartilage. Osteophytes were debrided using a rongeur. Anterior and posterior cruciate ligaments were excised. Two 4.0 mm Schanz pins were inserted in the femur and into the tibia for attachment of the array of trackers used for computer-assisted navigation. Hip center was identified using a circumduction technique. Distal landmarks were mapped using the computer. The distal femur and proximal tibia were mapped using the computer. The distal femoral cutting guide was positioned using computer-assisted navigation so as to achieve a 5 distal valgus cut. The femur was sized and it was felt that a size 5N femoral component was appropriate. A size 5 femoral cutting guide was positioned and the anterior cut was performed and verified using the computer. This was followed by completion of the posterior and chamfer cuts. Femoral cutting guide for the central box was then positioned in the center box cut was performed.  Attention was then directed to the proximal tibia. Medial and lateral menisci were excised. The extramedullary tibial cutting guide was positioned using computer-assisted navigation so as to achieve a 0 varus-valgus alignment and 3 posterior slope. The cut was  performed and verified using the computer. The proximal tibia was  sized and it was felt that a size 4 tibial tray was appropriate. Tibial and femoral trials were inserted followed by insertion of a 5 mm polyethylene insert. This allowed for excellent mediolateral soft tissue balancing both in flexion and in full extension. Finally, the patella was cut and prepared so as to accommodate a 35 mm medialized dome patella. A patella trial was placed and the knee was placed through a range of motion with excellent patellar tracking appreciated. The femoral trial was removed after debridement of posterior osteophytes. The central post-hole for the tibial component was reamed followed by insertion of a keel punch. Tibial trials were then removed. Cut surfaces of bone were irrigated with copious amounts of normal saline using pulsatile lavage and then suctioned dry. Polymethylmethacrylate cement was prepared in the usual fashion using a vacuum mixer. Cement was applied to the cut surface of the proximal tibia as well as along the undersurface of a size 4 rotating platform tibial component. Tibial component was positioned and impacted into place. Excess cement was removed using [employer] fast streamer. Cement was then applied to the cut surfaces of the femur as well as along the posterior flanges of the size 5N femoral component. The femoral component was positioned and impacted into place. Excess cement was removed using [employer] fast streamer. A 5 mm polyethylene trial was inserted and the knee was brought into full extension with steady axial compression applied. Finally, cement was applied to the backside of a 35 mm medialized dome patella and the patellar component was positioned and patellar clamp applied. Excess cement was removed using [employer] fast streamer. After adequate curing of the cement, the tourniquet was deflated after a total tourniquet time of 96 minutes. Hemostasis was achieved using electrocautery. The knee was irrigated with  copious amounts of normal saline using pulsatile lavage followed by 500 ml of Surgiphor and then suctioned dry. 20 mL of 1.3% Exparel and 60 mL of 0.25% Marcaine in 40 mL of normal saline was injected along the posterior capsule, medial and lateral gutters, and along the arthrotomy site. A 5 mm stabilized rotating platform polyethylene insert was inserted and the knee was placed through a range of motion with excellent mediolateral soft tissue balancing appreciated and excellent patellar tracking noted. 2 medium drains were placed in the wound bed and brought out through separate stab incisions. The medial parapatellar portion of the incision was reapproximated using interrupted sutures of #1 Vicryl. Subcutaneous tissue was approximated in layers using first #0 Vicryl followed #2-0 Vicryl. The skin was approximated with skin staples. A sterile dressing was applied.  The patient tolerated the procedure well and was transported to the recovery room in stable condition.    Yvonne Petite P. Holley Bouche., M.D.

## 2021-08-15 ENCOUNTER — Encounter: Payer: Self-pay | Admitting: Orthopedic Surgery

## 2021-08-15 MED ORDER — ENOXAPARIN SODIUM 40 MG/0.4ML IJ SOSY: 40.0000 mg | PREFILLED_SYRINGE | INTRAMUSCULAR | 0 refills | Status: AC

## 2021-08-15 MED ORDER — CELECOXIB 200 MG PO CAPS: 200.0000 mg | ORAL_CAPSULE | Freq: Two times a day (BID) | ORAL | 0 refills | Status: AC

## 2021-08-15 MED ORDER — OXYCODONE HCL 5 MG PO TABS: 5.0000 mg | ORAL_TABLET | ORAL | 0 refills | Status: AC | PRN

## 2021-08-15 MED ORDER — TRAMADOL HCL 50 MG PO TABS: 50.0000 mg | ORAL_TABLET | ORAL | 0 refills | Status: AC | PRN

## 2021-08-15 NOTE — Progress Notes (Signed)
  Subjective: 1 Day Post-Op Procedure(s) (LRB): COMPUTER ASSISTED TOTAL KNEE ARTHROPLASTY (Left) Patient reports pain as well-controlled.  A 5/10 this AM. Patient is well, and has had no acute complaints or problems Plan is to go Home after hospital stay. Negative for chest pain and shortness of breath Fever: no Gastrointestinal: negative for nausea and vomiting.  Patient has had a bowel movement.  Objective: Vital signs in last 24 hours: Temp:  [97.5 F (36.4 C)-98.2 F (36.8 C)] 97.9 F (36.6 C) (08/18 0406) Pulse Rate:  [74-100] 87 (08/18 0406) Resp:  [14-25] 17 (08/18 0406) BP: (94-134)/(57-109) 94/57 (08/18 0406) SpO2:  [88 %-96 %] 90 % (08/18 0406) Weight:  [95.3 kg] 95.3 kg (08/17 1103)  Intake/Output from previous day:  Intake/Output Summary (Last 24 hours) at 08/15/2021 0803 Last data filed at 08/15/2021 0536 Gross per 24 hour  Intake 2586.71 ml  Output 690 ml  Net 1896.71 ml    Intake/Output this shift: No intake/output data recorded.  Labs: No results for input(s): HGB in the last 72 hours. No results for input(s): WBC, RBC, HCT, PLT in the last 72 hours. No results for input(s): NA, K, CL, CO2, BUN, CREATININE, GLUCOSE, CALCIUM in the last 72 hours. No results for input(s): LABPT, INR in the last 72 hours.   EXAM General - Patient is Alert, Appropriate, and Oriented Extremity - Neurovascular intact Dorsiflexion/Plantar flexion intact Compartment soft Dressing/Incision -Postoperative dressing remains in place., Polar Care in place and working. , Hemovac in place.  Motor Function - intact, moving foot and toes well on exam. Able to perform SLR with assistance.   Cardiovascular- Regular rate and rhythm, no murmurs/rubs/gallops Respiratory- Lungs clear to auscultation bilaterally Gastrointestinal- soft, nontender, and active bowel sounds   Assessment/Plan: 1 Day Post-Op Procedure(s) (LRB): COMPUTER ASSISTED TOTAL KNEE ARTHROPLASTY (Left) Active  Problems:   Total knee replacement status  Estimated body mass index is 38.43 kg/m as calculated from the following:   Height as of this encounter: '5\' 2"'$  (1.575 m).   Weight as of this encounter: 95.3 kg. Advance diet Up with therapy  Possible d/c this PM pending completion of PT goals.      DVT Prophylaxis - Lovenox, Ted hose, and foot pumps Weight-Bearing as tolerated to left leg  Cassell Smiles, PA-C [facility] 08/15/2021, 8:03 AM

## 2021-08-15 NOTE — Evaluation (Signed)
Occupational Therapy Evaluation Patient Details Name: Victoria Li MRN: [id number] DOB: 07/21/1946 Today's Date: 08/15/2021    History of Present Illness 75 yo F s/p elective L TKA.   Clinical Impression   Pt seen for OT evaluation this date, POD#1 from above surgery. Pt was independent in all ADLs and functional mobility prior to surgery. Of note, pt had recent R TKA (March 2020). Pt denies any falls within past 6 months. Pt is eager to return to PLOF with less pain and improved safety and independence. Pt currently requires MIN GUARD for LB dressing while in seated position due limited AROM of L knee and decreased balance. Pt also requires SUPERVISION for standing grooming tasks and MIN GUARD for BSC transfers d/t decreased standing balance. Pt/family able to recall ~75% of education provided following R TKA (March 2020) as it pertains to polar care mgt, falls prevention strategies, home/routines modifications, DME/AE for LB bathing/dressing tasks, and compression stocking mgt. Pt/family re-educated on remaining 25% of information and handout provided. Pt would benefit from additional skilled OT services including additional opportunities to practice adaptive strategies for LB dressing/bathing to support recall and carryover prior to discharge and ultimately to maximize safety, independence, and minimize falls risk and caregiver burden. Do not currently anticipate any OT needs following this hospitalization.       Follow Up Recommendations  No OT follow up;Supervision - Intermittent    Equipment Recommendations  None recommended by OT       Precautions / Restrictions Precautions Precautions: Fall Restrictions Weight Bearing Restrictions: Yes LLE Weight Bearing: Weight bearing as tolerated      Mobility Bed Mobility               General bed mobility comments: not assessed as pt in recliner at beginning/end of session    Transfers Overall transfer level: Needs  assistance   Transfers: Sit to/from Stand Sit to Stand: Min guard;Supervision         General transfer comment: Pt with safe hand positioning and good eccentric control. Requires supervision for transfers from recliner and [place] for [facility] transfer    Balance Overall balance assessment: Needs assistance Sitting-balance support: No upper extremity supported;Feet supported Sitting balance-Leahy Scale: Good Sitting balance - Comments: Good sitting balance while reaching outside BOS to don socks   Standing balance support: No upper extremity supported;During functional activity Standing balance-Leahy Scale: Fair Standing balance comment: Fair static standing balance with UE unsupported during sinkside grooming tasks                           ADL either performed or assessed with clinical judgement   ADL Overall ADL's : Needs assistance/impaired     Grooming: Wash/dry hands;Supervision/safety;Standing               Lower Body Dressing: Min guard;Sitting/lateral leans Lower Body Dressing Details (indicate cue type and reason): to don/doff socks Toilet Transfer: Min guard;Ambulation;BSC;RW           Functional mobility during ADLs: Supervision/safety;Rolling walker        Pertinent Vitals/Pain Pain Assessment: No/denies pain        Extremity/Trunk Assessment Upper Extremity Assessment Upper Extremity Assessment: Overall WFL for tasks assessed   Lower Extremity Assessment Lower Extremity Assessment: Defer to PT evaluation   Cervical / Trunk Assessment Cervical / Trunk Assessment: Normal   Communication Communication Communication: No difficulties   Cognition Arousal/Alertness: Awake/alert Behavior During Therapy: WFL for tasks  assessed/performed Overall Cognitive Status: Within Functional Limits for tasks assessed                                 General Comments: Able to recall education provided from OT following R TKA (March  2020).      Exercises Other Exercises Other Exercises: Pt/family able to recall ~75% of education provided following R TKA (March 2020) as it pertains to polar care mgt, falls prevention strategies, home/routines modifications, DME/AE for LB bathing/dressing tasks, and compression stocking mgt. Pt/family re-educated on remaining 25% of information and handout provided.         Home Living Family/patient expects to be discharged to:: Private residence Living Arrangements: Spouse/significant other Available Help at Discharge: Available 24 hours/day;Family (husband and daughter) Type of Home: House Home Access: Stairs to enter [employer] of Steps: 8 STE from garage or 4-5 STE front door with 40-50' sidewalk Entrance Stairs-Rails: Right;Left;Can reach both [place]: One level     Bathroom Shower/Tub: [place]: [place] - single point;Grab bars - toilet;Bedside commode;Adaptive equipment;Walker - 2 wheels Adaptive Equipment: [place]        Prior Functioning/Environment Level of Independence: Independent        Comments: Pt was independent with ADLs and functional mobility prior to admission. Denies any falls within past 6 months        OT Problem List: Decreased range of motion;Decreased activity tolerance;Impaired balance (sitting and/or standing)      OT Treatment/Interventions: Self-care/ADL training;Therapeutic exercise;DME and/or AE instruction;Therapeutic activities;Patient/family education;Balance training    OT Goals(Current goals can be found in the care plan section) Acute Rehab OT Goals Patient Stated Goal: to go home OT Goal Formulation: With patient Time For Goal Achievement: 08/29/21 Potential to Achieve Goals: Good ADL Goals Pt Will Perform Grooming: with modified independence;standing Pt Will Perform Lower Body Dressing: with modified independence;sit to/from stand Pt Will Transfer to Toilet: with modified  independence;ambulating;bedside commode  OT Frequency: Min 1X/week    AM-PAC OT "6 Clicks" Daily Activity     Outcome Measure Help from another person eating meals?: None Help from another person taking care of personal grooming?: A Little Help from another person toileting, which includes using toliet, bedpan, or urinal?: A Little Help from another person bathing (including washing, rinsing, drying)?: A Little Help from another person to put on and taking off regular upper body clothing?: None Help from another person to put on and taking off regular lower body clothing?: A Little 6 Click Score: 20   End of Session Equipment Utilized During Treatment: Gait belt;Rolling walker Nurse Communication: Mobility status  Activity Tolerance: Patient tolerated treatment well Patient left: in chair;with call bell/phone within reach;with family/visitor present  OT Visit Diagnosis: Unsteadiness on feet (R26.81)                Time: [id number] OT Time Calculation (min): 29 min Charges:  OT General Charges $OT Visit: 1 Visit OT Evaluation $OT Eval Moderate Complexity: 1 Mod OT Treatments $Self Care/Home Management : 23-37 mins  Fredirick Maudlin, OTR/L [place]

## 2021-08-15 NOTE — Discharge Summary (Signed)
Physician Discharge Summary  Patient ID: Victoria Li MRN: [id number] DOB/AGE: 09-06-1946 76 y.o.  Admit date: 08/14/2021 Discharge date: 08/15/2021  Admission Diagnoses:  Total knee replacement status [Z96.659]  Surgeries:Procedure(s): Left total knee arthroplasty using computer-assisted navigation   SURGEON:  Marciano Sequin. M.D.   ASSISTANT: Cassell Smiles, PA-C (present and scrubbed throughout the case, critical for assistance with exposure, retraction, instrumentation, and closure)   ANESTHESIA: spinal   ESTIMATED BLOOD LOSS: 50 mL   FLUIDS REPLACED: 1400 mL of crystalloid   TOURNIQUET TIME: 96 minutes   DRAINS: 2 medium Hemovac drains   SOFT TISSUE RELEASES: Anterior cruciate ligament, posterior cruciate ligament, deep medial collateral ligament, patellofemoral ligament   IMPLANTS UTILIZED: DePuy Attune size 5N posterior stabilized femoral component (cemented), size 4 rotating platform tibial component (cemented), 35 mm medialized dome patella (cemented), and a 5 mm stabilized rotating platform polyethylene insert.  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 female who was admitted  08/14/2021 with a diagnosis of left knee osteoarthritis and went to the operating room on 08/14/2021 and underwent left total knee arthoplasty. The patient received perioperative antibiotics for prophylaxis (see below). The patient tolerated the procedure well and was transported to PACU in stable condition. After meeting PACU criteria, the patient was subsequently transferred to the Orthopaedics/Rehabilitation unit.   The patient received DVT prophylaxis in the form of early mobilization, Lovenox, Foot Pumps, and TED hose. A sacral pad had been placed and heels were elevated off of the bed with rolled towels in order to protect skin integrity. Foley catheter was discontinued on postoperative day #0. Wound drains were discontinued on postoperative day #1. The surgical incision was healing well without signs of infection.  Physical therapy was initiated postoperatively for transfers, gait training, and strengthening. Occupational therapy was initiated for activities of daily living and evaluation for assisted devices. Rehabilitation goals were reviewed in detail with the patient. The patient made steady progress with physical therapy and physical therapy recommended discharge to Home.   The patient achieved the preliminary goals of this hospitalization and was felt to be medically and orthopaedically appropriate for discharge.  She was given perioperative antibiotics:  Anti-infectives (From admission, onward)    Start     Dose/Rate Route Frequency Ordered Stop   08/14/21 2115  ceFAZolin (ANCEF) IVPB 2g/100 mL premix        2 g 200 mL/hr over 30 Minutes Intravenous Every 6 hours 08/14/21 2021 08/15/21 0807   08/14/21 1055  ceFAZolin (ANCEF) 2-4 GM/100ML-% IVPB       Note to Pharmacy: Sylvester Harder   : cabinet override  08/14/21 1055 08/14/21 1526   08/14/21 0600  ceFAZolin (ANCEF) IVPB 2g/100 mL premix        2 g 200 mL/hr over 30 Minutes Intravenous On call to O.R. 08/13/21 2336 08/14/21 1445      .  Recent vital signs:  Vitals:   08/15/21 0406 08/15/21 0804  BP: (!) 94/57 110/62  Pulse: 87 85  Resp: 17 17  Temp: 97.9 F (36.6 C) 97.7 F (36.5 C)  SpO2: 90% 94%    Recent laboratory studies:  No results for input(s): WBC, HGB, HCT, PLT, K, CL, CO2, BUN, CREATININE, GLUCOSE, CALCIUM, LABPT, INR in the last 72 hours.  Diagnostic Studies: DG Knee Left Port  Result Date: 08/14/2021 CLINICAL DATA:  Status post left knee replacement, initial encounter EXAM: PORTABLE LEFT KNEE - 2 VIEW COMPARISON:  None. FINDINGS: Postsurgical changes are noted with knee replacement. No soft tissue or bony abnormality is seen. Surgical drains are noted in place. IMPRESSION: Status post left knee replacement Electronically Signed   By: Inez Catalina M.D.   On: 08/14/2021 19:40    Discharge Medications:   Allergies as of 08/15/2021   No Known Allergies      Medication List     STOP taking these medications    aspirin EC 81 MG tablet   naproxen sodium 220 MG tablet Commonly known as: ALEVE       TAKE these medications    acetaminophen 650 MG CR tablet Commonly known as: TYLENOL Take 650 mg by mouth every 8 (eight) hours as needed for pain.   ADULT GUMMY PO Take 1 capsule by mouth daily.   brimonidine 0.2 % ophthalmic solution Commonly known as: ALPHAGAN Place 1 drop into both eyes 2 (two) times daily.   CALCIUM-D PO Take 1 capsule by mouth daily.   celecoxib 200 MG capsule Commonly known as: CELEBREX Take 1 capsule (200 mg total) by mouth 2 (two) times daily.   enoxaparin 40 MG/0.4ML injection Commonly known as: LOVENOX Inject 0.4 mLs (40 mg total) into the skin daily for 14 days.   famotidine 20 MG tablet Commonly known as: PEPCID Take 20 mg by mouth daily.   FOLIC ACID PO Take 1 capsule by mouth 2 (two) times a week.   latanoprost 0.005 % ophthalmic solution Commonly known as: XALATAN Place 1 drop into both eyes at bedtime.   MEDERMA EX Apply 1  application topically daily.   oxyCODONE 5 MG immediate release tablet Commonly known as: Oxy IR/ROXICODONE Take 1 tablet (5 mg total) by mouth every 4 (four) hours as needed for severe pain (pain score 6 or greater).   STOOL SOFTENER PO Take 1 tablet by mouth daily as needed (constipation).   timolol 0.5 % ophthalmic solution Commonly known as: BETIMOL Place 1 drop into both eyes 2 (two) times daily.   traMADol 50 MG tablet Commonly known as: ULTRAM Take 1 tablet (50 mg total) by mouth every 4 (four) hours as needed for moderate pain.               Durable Medical Equipment  (From admission, onward)           Start     Ordered   08/14/21 2022  DME Walker rolling  Once       Question:  Patient needs a walker to treat with the following condition  Answer:  Total knee replacement status   08/14/21 2021   08/14/21 2022  DME Bedside commode  Once  Question:  Patient needs a bedside commode to treat with the following condition  Answer:  Total knee replacement status   08/14/21 2021            Disposition: Home with home health PT     Follow-up Information     Fausto Skillern, PA-C Follow up on 08/29/2021.   Specialty: Orthopedic Surgery Why: at 9:45am Contact information: [place] [address] [phone]         Dereck Leep, MD Follow up on 09/26/2021.   Specialty: Orthopedic Surgery Why: at 3:00pm Contact information: [place] [id number] [place], PA-C 08/15/2021, 12:31 PM

## 2021-08-15 NOTE — Plan of Care (Signed)
Post-op dressing removed. , Hemovac removed., Mini compression dressing applied. , and Fresh honeycomb dressing applied.   

## 2021-08-15 NOTE — Anesthesia Postprocedure Evaluation (Signed)
Anesthesia Post Note  Patient: Victoria Li  Procedure(s) Performed: COMPUTER ASSISTED TOTAL KNEE ARTHROPLASTY (Left: Knee)  Patient location during evaluation: Nursing Unit Anesthesia Type: Spinal Level of consciousness: oriented and awake and alert Pain management: pain level controlled Vital Signs Assessment: post-procedure vital signs reviewed and stable Respiratory status: spontaneous breathing and respiratory function stable Cardiovascular status: blood pressure returned to baseline and stable Postop Assessment: no headache, no backache, no apparent nausea or vomiting and patient able to bend at knees Anesthetic complications: no   No notable events documented.   Last Vitals:  Vitals:   08/14/21 2215 08/15/21 0406  BP: 134/76 (!) 94/57  Pulse: 98 87  Resp: 17 17  Temp: (!) 36.4 C 36.6 C  SpO2: 93% 90%    Last Pain:  Vitals:   08/14/21 2232  TempSrc:   PainSc: 9                  Naol Ontiveros [address]

## 2021-08-15 NOTE — TOC Progression Note (Signed)
Transition of Care [facility]) - Progression Note    Patient Details  Name: Victoria Li MRN: [id number] Date of Birth: 08-25-1946  Transition of Care [facility]) CM/SW [place], RN Phone Number: 08/15/2021, 1:16 PM  Clinical Narrative:     Patient has 3 in 1 and RW at home, is set up with [facility] has transportation and can afford her medication       Expected Discharge Plan and Services           Expected Discharge Date: 08/15/21                                     Social Determinants of Health (SDOH) Interventions    Readmission Risk Interventions No flowsheet data found.

## 2021-08-15 NOTE — Evaluation (Signed)
Physical Therapy Evaluation Patient Details Name: Victoria Li MRN: [id number] DOB: 06-Jul-1946 Today's Date: 08/15/2021   History of Present Illness  75 yo F s/p elective L TKA, h/o R TKA 3/22.  Clinical Impression  Pt did well with all aspects of POD1 PT session.  She showed good quad control, great ROM, very limited pain and was able to ambulate 200 ft and ascend/descend 8 steps w/o physical assist.  Pt had R knee replaced ~6 months ago and did very well with her recovery, feels confident with how she is doing now with the L.  Pt eager to d/c home this afternoon.     Follow surgeon's recommendation for DC plan and follow-up therapies;Home health PT    Equipment Recommendations  None recommended by PT    Recommendations for Other Services       Precautions / Restrictions Precautions Precautions: Fall Restrictions Weight Bearing Restrictions: Yes LLE Weight Bearing: Weight bearing as tolerated      Mobility  Bed Mobility Overal bed mobility: Independent             General bed mobility comments: not assessed as pt in recliner at beginning/end of session    Transfers Overall transfer level: Modified independent Equipment used: Rolling walker (2 wheeled) Transfers: Sit to/from Stand Sit to Stand: Supervision         General transfer comment: Minimal cuing and no direct assist with safe hand positioning and good eccentric control.  Ambulation/Gait Ambulation/Gait assistance: Supervision Gait Distance (Feet): 200 Feet Assistive device: Rolling walker (2 wheeled)       General Gait Details: Pt with good safety and confidence with ambulation, initially with walker stopping during L stance phase, but consistent walker movement during last half of the effort  Stairs Stairs: Yes Stairs assistance: Supervision Stair Management: Two rails;Step to pattern Number of Stairs: 8 General stair comments: Pt able to ascend/descend steps with out assist, recalled  appropriate strategy from prior TKA w/o cuing.  She was very reliant on UEs/rails during descent with R eccentric control.  Wheelchair Mobility    Modified Rankin (Stroke Patients Only)       Balance Overall balance assessment: Needs assistance Sitting-balance support: No upper extremity supported;Feet supported Sitting balance-Leahy Scale: Good Sitting balance - Comments: Good sitting balance while reaching outside BOS to don socks   Standing balance support: No upper extremity supported;During functional activity Standing balance-Leahy Scale: Fair Standing balance comment: no issues with b/l UEs on walker, w/o UEs did show mild unsteadiness that she easily self arrested                             Pertinent Vitals/Pain Pain Assessment: 0-10 Pain Score: 1     Home Living Family/patient expects to be discharged to:: Private residence Living Arrangements: Spouse/significant other Available Help at Discharge: Available 24 hours/day;Family Type of Home: House Home Access: Stairs to enter Entrance Stairs-Rails: Right;Left;Can reach both Entrance Stairs-Number of Steps: 8 STE from garage or 4-5 STE front door with 40-50' sidewalk Home Layout: One level Home Equipment: Cane - single point;Grab bars - toilet;Bedside commode;Adaptive equipment;Walker - 2 wheels      Prior Function Level of Independence: Independent         Comments: Pt was independent with ADLs and functional mobility prior to admission. Denies any falls within past 6 months     Hand Dominance        Extremity/Trunk Assessment  Upper Extremity Assessment Upper Extremity Assessment: Overall WFL for tasks assessed    Lower Extremity Assessment Lower Extremity Assessment: Overall WFL for tasks assessed (good quad set, SLR and ROM in L LE)    Cervical / Trunk Assessment Cervical / Trunk Assessment: Normal  Communication   Communication: No difficulties  Cognition Arousal/Alertness:  Awake/alert Behavior During Therapy: WFL for tasks assessed/performed Overall Cognitive Status: Within Functional Limits for tasks assessed                                 General Comments: Able to recall education provided from OT following R TKA (March 2020).      General Comments General comments (skin integrity, edema, etc.): Pt did very well with POD1 PT exam and subsequent gait and exercises, good metrics and function t/o    Exercises Total Joint Exercises Quad Sets: Strengthening;10 reps Short Arc Quad: Strengthening;10 reps Heel Slides: Strengthening;10 reps Hip ABduction/ADduction: Strengthening;10 reps Straight Leg Raises: AROM;10 reps Long Arc Quad: AROM;10 reps Knee Flexion: PROM;5 reps Goniometric ROM: 0-102 Other Exercises Other Exercises: Pt/family able to recall ~75% of education provided following R TKA (March 2020) as it pertains to polar care mgt, falls prevention strategies, home/routines modifications, DME/AE for LB bathing/dressing tasks, and compression stocking mgt. Pt/family re-educated on remaining 25% of information and handout provided.   Assessment/Plan    PT Assessment Patient needs continued PT services  PT Problem List Decreased strength;Decreased range of motion;Decreased activity tolerance;Decreased balance;Decreased mobility;Decreased knowledge of use of DME;Decreased safety awareness;Pain       PT Treatment Interventions DME instruction;Gait training;Stair training;Functional mobility training;Therapeutic activities;Therapeutic exercise;Balance training;Cognitive remediation;Patient/family education    PT Goals (Current goals can be found in the Care Plan section)  Acute Rehab PT Goals Patient Stated Goal: to go home today PT Goal Formulation: With patient Time For Goal Achievement: 08/29/21 Potential to Achieve Goals: Good    Frequency BID   Barriers to discharge        Co-evaluation               AM-PAC PT "6  Clicks" Mobility  Outcome Measure Help needed turning from your back to your side while in a flat bed without using bedrails?: None Help needed moving from lying on your back to sitting on the side of a flat bed without using bedrails?: None Help needed moving to and from a bed to a chair (including a wheelchair)?: None Help needed standing up from a chair using your arms (e.g., wheelchair or bedside chair)?: None Help needed to walk in hospital room?: None Help needed climbing 3-5 steps with a railing? : None 6 Click Score: 24    End of Session Equipment Utilized During Treatment: Gait belt Activity Tolerance: Patient tolerated treatment well Patient left: in chair;with family/visitor present;with call bell/phone within reach Nurse Communication: Mobility status PT Visit Diagnosis: Muscle weakness (generalized) (M62.81);Difficulty in walking, not elsewhere classified (R26.2)    Time: [id number] PT Time Calculation (min) (ACUTE ONLY): 43 min   Charges:   PT Evaluation $PT Eval Low Complexity: 1 Low PT Treatments $Gait Training: 8-22 mins $Therapeutic Exercise: 8-22 mins        Kreg Shropshire, DPT 08/15/2021, 12:27 PM

## 2021-08-17 DIAGNOSIS — Z96653 Presence of artificial knee joint, bilateral: Secondary | ICD-10-CM | POA: Diagnosis not present

## 2021-08-17 DIAGNOSIS — K219 Gastro-esophageal reflux disease without esophagitis: Secondary | ICD-10-CM | POA: Diagnosis not present

## 2021-08-17 DIAGNOSIS — Z791 Long term (current) use of non-steroidal anti-inflammatories (NSAID): Secondary | ICD-10-CM | POA: Diagnosis not present

## 2021-08-17 DIAGNOSIS — Q796 Ehlers-Danlos syndrome, unspecified: Secondary | ICD-10-CM | POA: Diagnosis not present

## 2021-08-17 DIAGNOSIS — I10 Essential (primary) hypertension: Secondary | ICD-10-CM | POA: Diagnosis not present

## 2021-08-17 DIAGNOSIS — H409 Unspecified glaucoma: Secondary | ICD-10-CM | POA: Diagnosis not present

## 2021-08-17 DIAGNOSIS — Z471 Aftercare following joint replacement surgery: Secondary | ICD-10-CM | POA: Diagnosis not present

## 2021-08-29 ENCOUNTER — Inpatient Hospital Stay: Payer: PPO | Admitting: Family Medicine

## 2021-08-29 DIAGNOSIS — T8141XA Infection following a procedure, superficial incisional surgical site, initial encounter: Secondary | ICD-10-CM | POA: Diagnosis not present

## 2021-08-29 DIAGNOSIS — Z96652 Presence of left artificial knee joint: Secondary | ICD-10-CM | POA: Diagnosis not present

## 2021-08-29 DIAGNOSIS — Q796 Ehlers-Danlos syndrome, unspecified: Secondary | ICD-10-CM | POA: Diagnosis not present

## 2021-08-29 NOTE — Progress Notes (Deleted)
      Established patient visit   Patient: Victoria Li   DOB: 1946/08/26   75 y.o. Female  MRN: [id number] Visit Date: 08/29/2021  Today's healthcare provider: Wilhemena Durie, MD   No chief complaint on file.  Subjective  -------------------------------------------------------------------------------------------------------------------- HPI  Follow up Hospitalization  Patient was admitted to [facility] on 08/14/2021 and discharged on 08/15/2021. She was treated for Left total knee arthroplasty using computer-assisted navigation. Treatment for this included: see notes in chart. Telephone follow up was done on *** She reports {excellent/good/fair:19992} compliance with treatment. She reports this condition is {resolved/improved/worsened:23923}.  ----------------------------------------------------------------------------------------- -   {Insert patient history into note(optional):23778}  Medications: Outpatient Medications Prior to Visit  Medication Sig   acetaminophen (TYLENOL) 650 MG CR tablet Take 650 mg by mouth every 8 (eight) hours as needed for pain.   brimonidine (ALPHAGAN) 0.2 % ophthalmic solution Place 1 drop into both eyes 2 (two) times daily.   Calcium Carbonate-Vitamin D (CALCIUM-D PO) Take 1 capsule by mouth daily.   celecoxib (CELEBREX) 200 MG capsule Take 1 capsule (200 mg total) by mouth 2 (two) times daily.   Docusate Calcium (STOOL SOFTENER PO) Take 1 tablet by mouth daily as needed (constipation).   enoxaparin (LOVENOX) 40 MG/0.4ML injection Inject 0.4 mLs (40 mg total) into the skin daily for 14 days.   famotidine (PEPCID) 20 MG tablet Take 20 mg by mouth daily.   FOLIC ACID PO Take 1 capsule by mouth 2 (two) times a week.   latanoprost (XALATAN) 0.005 % ophthalmic solution Place 1 drop into both eyes at bedtime.   Multiple Vitamins-Minerals (ADULT GUMMY PO) Take 1 capsule by mouth daily.   oxyCODONE (OXY IR/ROXICODONE) 5 MG immediate  release tablet Take 1 tablet (5 mg total) by mouth every 4 (four) hours as needed for severe pain (pain score 6 or greater).   Scar Treatment Products [facility] EX) Apply 1 application topically daily.   timolol (BETIMOL) 0.5 % ophthalmic solution Place 1 drop into both eyes 2 (two) times daily.   traMADol (ULTRAM) 50 MG tablet Take 1 tablet (50 mg total) by mouth every 4 (four) hours as needed for moderate pain.   No facility-administered medications prior to visit.    Review of Systems  Constitutional:  Negative for appetite change, chills, fatigue and fever.  Respiratory:  Negative for chest tightness and shortness of breath.   Cardiovascular:  Negative for chest pain and palpitations.  Gastrointestinal:  Negative for abdominal pain, nausea and vomiting.  Neurological:  Negative for dizziness and weakness.   {Labs  Heme  Chem  Endocrine  Serology  Results Review (optional):23779}   Objective  -------------------------------------------------------------------------------------------------------------------- There were no vitals taken for this visit. {Show previous vital signs (optional):23777}  Physical Exam  ***  No results found for any visits on 08/29/21.  Assessment & Plan  ---------------------------------------------------------------------------------------------------------------------- ***  No follow-ups on file.      {provider attestation***:1}   Wilhemena Durie, MD  [facility] [phone] (phone) [phone] (fax)  [place]

## 2021-08-30 DIAGNOSIS — Z471 Aftercare following joint replacement surgery: Secondary | ICD-10-CM | POA: Diagnosis not present

## 2021-09-23 DIAGNOSIS — Z96652 Presence of left artificial knee joint: Secondary | ICD-10-CM | POA: Diagnosis not present

## 2021-09-23 DIAGNOSIS — R29898 Other symptoms and signs involving the musculoskeletal system: Secondary | ICD-10-CM | POA: Diagnosis not present

## 2021-09-23 DIAGNOSIS — M25662 Stiffness of left knee, not elsewhere classified: Secondary | ICD-10-CM | POA: Diagnosis not present

## 2021-09-26 DIAGNOSIS — Z96652 Presence of left artificial knee joint: Secondary | ICD-10-CM | POA: Diagnosis not present

## 2021-09-26 DIAGNOSIS — M25662 Stiffness of left knee, not elsewhere classified: Secondary | ICD-10-CM | POA: Diagnosis not present

## 2021-10-04 DIAGNOSIS — M25662 Stiffness of left knee, not elsewhere classified: Secondary | ICD-10-CM | POA: Diagnosis not present

## 2021-10-04 DIAGNOSIS — Z96652 Presence of left artificial knee joint: Secondary | ICD-10-CM | POA: Diagnosis not present

## 2021-10-08 DIAGNOSIS — M25662 Stiffness of left knee, not elsewhere classified: Secondary | ICD-10-CM | POA: Diagnosis not present

## 2021-10-08 DIAGNOSIS — Z96652 Presence of left artificial knee joint: Secondary | ICD-10-CM | POA: Diagnosis not present

## 2021-10-11 DIAGNOSIS — Z96652 Presence of left artificial knee joint: Secondary | ICD-10-CM | POA: Diagnosis not present

## 2022-01-28 DIAGNOSIS — N200 Calculus of kidney: Secondary | ICD-10-CM | POA: Diagnosis not present

## 2022-01-28 DIAGNOSIS — Z1159 Encounter for screening for other viral diseases: Secondary | ICD-10-CM | POA: Diagnosis not present

## 2022-01-28 DIAGNOSIS — R739 Hyperglycemia, unspecified: Secondary | ICD-10-CM | POA: Diagnosis not present

## 2022-01-28 DIAGNOSIS — Q796 Ehlers-Danlos syndrome, unspecified: Secondary | ICD-10-CM | POA: Diagnosis not present

## 2022-01-28 DIAGNOSIS — S61219S Laceration without foreign body of unspecified finger without damage to nail, sequela: Secondary | ICD-10-CM | POA: Diagnosis not present

## 2022-01-28 DIAGNOSIS — Z780 Asymptomatic menopausal state: Secondary | ICD-10-CM | POA: Diagnosis not present

## 2022-01-28 DIAGNOSIS — Z0000 Encounter for general adult medical examination without abnormal findings: Secondary | ICD-10-CM | POA: Diagnosis not present

## 2022-01-28 DIAGNOSIS — Z79899 Other long term (current) drug therapy: Secondary | ICD-10-CM | POA: Diagnosis not present

## 2022-03-11 DIAGNOSIS — Z96651 Presence of right artificial knee joint: Secondary | ICD-10-CM | POA: Diagnosis not present

## 2022-03-11 DIAGNOSIS — Z96653 Presence of artificial knee joint, bilateral: Secondary | ICD-10-CM | POA: Diagnosis not present

## 2022-03-11 DIAGNOSIS — Z96652 Presence of left artificial knee joint: Secondary | ICD-10-CM | POA: Diagnosis not present

## 2022-04-28 DIAGNOSIS — L237 Allergic contact dermatitis due to plants, except food: Secondary | ICD-10-CM | POA: Diagnosis not present

## 2022-05-23 DIAGNOSIS — H401112 Primary open-angle glaucoma, right eye, moderate stage: Secondary | ICD-10-CM | POA: Diagnosis not present

## 2022-05-23 DIAGNOSIS — E785 Hyperlipidemia, unspecified: Secondary | ICD-10-CM | POA: Diagnosis not present

## 2022-05-23 DIAGNOSIS — R7303 Prediabetes: Secondary | ICD-10-CM | POA: Diagnosis not present

## 2022-06-04 DIAGNOSIS — R7303 Prediabetes: Secondary | ICD-10-CM | POA: Diagnosis not present

## 2022-06-04 DIAGNOSIS — E785 Hyperlipidemia, unspecified: Secondary | ICD-10-CM | POA: Diagnosis not present

## 2022-06-04 DIAGNOSIS — Q796 Ehlers-Danlos syndrome, unspecified: Secondary | ICD-10-CM | POA: Diagnosis not present

## 2022-06-04 DIAGNOSIS — Z780 Asymptomatic menopausal state: Secondary | ICD-10-CM | POA: Diagnosis not present

## 2022-06-23 ENCOUNTER — Other Ambulatory Visit: Payer: Self-pay | Admitting: Obstetrics and Gynecology

## 2022-06-23 ENCOUNTER — Other Ambulatory Visit: Payer: Self-pay | Admitting: Internal Medicine

## 2022-06-23 DIAGNOSIS — Z1231 Encounter for screening mammogram for malignant neoplasm of breast: Secondary | ICD-10-CM

## 2022-06-26 DIAGNOSIS — Z1231 Encounter for screening mammogram for malignant neoplasm of breast: Secondary | ICD-10-CM | POA: Diagnosis not present

## 2022-06-26 DIAGNOSIS — Z124 Encounter for screening for malignant neoplasm of cervix: Secondary | ICD-10-CM | POA: Diagnosis not present

## 2022-06-26 DIAGNOSIS — Z1331 Encounter for screening for depression: Secondary | ICD-10-CM | POA: Diagnosis not present

## 2022-07-14 ENCOUNTER — Ambulatory Visit
Admission: RE | Admit: 2022-07-14 | Discharge: 2022-07-14 | Disposition: A | Discharge status: Home / Self Care | Payer: PPO | Source: Ambulatory Visit | Attending: Internal Medicine | Admitting: Internal Medicine

## 2022-07-14 DIAGNOSIS — Z1231 Encounter for screening mammogram for malignant neoplasm of breast: Secondary | ICD-10-CM | POA: Diagnosis not present

## 2022-08-05 DIAGNOSIS — H401132 Primary open-angle glaucoma, bilateral, moderate stage: Secondary | ICD-10-CM | POA: Diagnosis not present

## 2022-08-05 DIAGNOSIS — H2513 Age-related nuclear cataract, bilateral: Secondary | ICD-10-CM | POA: Diagnosis not present

## 2022-08-27 DIAGNOSIS — M13841 Other specified arthritis, right hand: Secondary | ICD-10-CM | POA: Diagnosis not present

## 2022-08-27 DIAGNOSIS — G5600 Carpal tunnel syndrome, unspecified upper limb: Secondary | ICD-10-CM | POA: Insufficient documentation

## 2022-08-27 DIAGNOSIS — M13849 Other specified arthritis, unspecified hand: Secondary | ICD-10-CM | POA: Diagnosis not present

## 2022-08-27 DIAGNOSIS — M19049 Primary osteoarthritis, unspecified hand: Secondary | ICD-10-CM | POA: Insufficient documentation

## 2022-08-27 DIAGNOSIS — G5601 Carpal tunnel syndrome, right upper limb: Secondary | ICD-10-CM | POA: Diagnosis not present

## 2022-09-11 DIAGNOSIS — Z96653 Presence of artificial knee joint, bilateral: Secondary | ICD-10-CM | POA: Diagnosis not present

## 2022-10-01 ENCOUNTER — Ambulatory Visit: Payer: PPO | Admitting: Podiatry

## 2022-10-15 ENCOUNTER — Ambulatory Visit (INDEPENDENT_AMBULATORY_CARE_PROVIDER_SITE_OTHER): Payer: PPO

## 2022-10-15 ENCOUNTER — Encounter: Payer: Self-pay | Admitting: Podiatry

## 2022-10-15 ENCOUNTER — Other Ambulatory Visit: Payer: Self-pay | Admitting: Podiatry

## 2022-10-15 ENCOUNTER — Ambulatory Visit (INDEPENDENT_AMBULATORY_CARE_PROVIDER_SITE_OTHER): Payer: PPO | Admitting: Podiatry

## 2022-10-15 DIAGNOSIS — M778 Other enthesopathies, not elsewhere classified: Secondary | ICD-10-CM

## 2022-10-15 DIAGNOSIS — M7751 Other enthesopathy of right foot: Secondary | ICD-10-CM

## 2022-10-15 MED ORDER — GABAPENTIN 100 MG PO CAPS: 100.0000 mg | ORAL_CAPSULE | Freq: Every day | ORAL | 0 refills | Status: DC

## 2022-10-15 NOTE — Progress Notes (Signed)
Subjective:  Patient ID: Victoria Li, female    DOB: 1946-01-20,  MRN: [id number] HPI Chief Complaint  Patient presents with   Foot Pain    Bilateral feet - numbness, burning over 1 year, not diabetic, swelling, constant sensations, just intermittent intensity, has a connective tissue disorder as well, just had bilateral knee replacement, tried insoles   New Patient (Initial Visit)    Est pt 05/2019    76 y.o. female presents with the above complaint.   ROS: Denies fever chills nausea vomit muscle aches pains calf pain back pain chest pain shortness of breath.  Past Medical History:  Diagnosis Date   Bruises easily    Chicken pox    Ehlers-Danlos syndrome    GERD (gastroesophageal reflux disease)    Glaucoma    Hammer toe    Multiple lung nodules on CT    Swelling    Past Surgical History:  Procedure Laterality Date   amputation toe     BREAST BIOPSY Left    benign   COLONOSCOPY  2016   COLONOSCOPY WITH PROPOFOL N/A 08/31/2020   Procedure: COLONOSCOPY WITH PROPOFOL;  Surgeon: Robert Bellow, MD;  Location: [facility];  Service: Endoscopy;  Laterality: N/A;   FOOT SURGERY Bilateral 2014   toes removed   HAMMER TOE SURGERY Bilateral    HYSTEROSCOPY WITH D & C N/A 11/30/2020   Procedure: DILATATION AND CURETTAGE /HYSTEROSCOPY;  Surgeon: Benjaman Kindler, MD;  Location: [facility];  Service: Gynecology;  Laterality: N/A;   KNEE ARTHROPLASTY Right 03/27/2021   Procedure: COMPUTER ASSISTED TOTAL KNEE ARTHROPLASTY;  Surgeon: Dereck Leep, MD;  Location: [facility];  Service: Orthopedics;  Laterality: Right;   KNEE ARTHROPLASTY Left 08/14/2021   Procedure: COMPUTER ASSISTED TOTAL KNEE ARTHROPLASTY;  Surgeon: Dereck Leep, MD;  Location: [facility] OR[facility];  Service: Orthopedics;  Laterality: Left;   Left finger surgery  11/2019   scar tissue removed     ELBOWS    Current Outpatient Medications:    gabapentin (NEURONTIN) 100 MG capsule, Take 1 capsule (100 mg total)  by mouth at bedtime., Disp: 30 capsule, Rfl: 0   acetaminophen (TYLENOL) 650 MG CR tablet, Take 650 mg by mouth every 8 (eight) hours as needed for pain., Disp: , Rfl:    brimonidine (ALPHAGAN) 0.2 % ophthalmic solution, Place 1 drop into both eyes 2 (two) times daily., Disp: , Rfl:    Calcium Carbonate-Vitamin D (CALCIUM-D PO), Take 1 capsule by mouth daily., Disp: , Rfl:    celecoxib (CELEBREX) 200 MG capsule, Take 1 capsule (200 mg total) by mouth 2 (two) times daily., Disp: 90 capsule, Rfl: 0   Docusate Calcium (STOOL SOFTENER PO), Take 1 tablet by mouth daily as needed (constipation)., Disp: , Rfl:    enoxaparin (LOVENOX) 40 MG/0.4ML injection, Inject 0.4 mLs (40 mg total) into the skin daily for 14 days., Disp: 5.6 mL, Rfl: 0   famotidine (PEPCID) 20 MG tablet, Take 20 mg by mouth daily., Disp: , Rfl:    FOLIC ACID PO, Take 1 capsule by mouth 2 (two) times a week., Disp: , Rfl:    latanoprost (XALATAN) 0.005 % ophthalmic solution, Place 1 drop into both eyes at bedtime., Disp: , Rfl:    Multiple Vitamins-Minerals (ADULT GUMMY PO), Take 1 capsule by mouth daily., Disp: , Rfl:    oxyCODONE (OXY IR/ROXICODONE) 5 MG immediate release tablet, Take 1 tablet (5 mg total) by mouth every 4 (four) hours as needed for severe pain (pain  score 6 or greater)., Disp: 20 tablet, Rfl: 0   Scar Treatment Products ([place] EX), Apply 1 application topically daily., Disp: , Rfl:    timolol (BETIMOL) 0.5 % ophthalmic solution, Place 1 drop into both eyes 2 (two) times daily., Disp: , Rfl:    traMADol (ULTRAM) 50 MG tablet, Take 1 tablet (50 mg total) by mouth every 4 (four) hours as needed for moderate pain., Disp: 30 tablet, Rfl: 0  No Known Allergies Review of Systems Objective:  There were no vitals filed for this visit.  General: Well developed, nourished, in no acute distress, alert and oriented x3   Dermatological: Skin is warm, dry and supple bilateral. Nails x 10 are well maintained; remaining  integument appears unremarkable at this time. There are no open sores, no preulcerative lesions, no rash or signs of infection present.  Vascular: Dorsalis Pedis artery and Posterior Tibial artery pedal pulses are 2/4 bilateral with immedate capillary fill time. Pedal hair growth present. No varicosities and no lower extremity edema present bilateral.   Neruologic: Grossly intact via light touch bilateral. Vibratory intact via tuning fork bilateral. Protective threshold with Semmes Wienstein monofilament intact to all pedal sites bilateral. Patellar and Achilles deep tendon reflexes 2+ bilateral. No Babinski or clonus noted bilateral.   Musculoskeletal: No gross boney pedal deformities bilateral. No pain, crepitus, or limitation noted with foot and ankle range of motion bilateral. Muscular strength 5/5 in all groups tested bilateral.  Severe flatfoot deformity with severe hallux valgus deformity with right hallux overlapping the second toe dorsally.  Gait: Unassisted, Nonantalgic.    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  Assessment & Plan:   Assessment: Severe pes planus bilateral.  Neuropathy.  Plan: Start her on 100 mg of gabapentin would like to follow-up with her in 1 month consider increasing from the night dose to twice daily.     Ayomikun Starling T. [place], [place]

## 2022-11-17 ENCOUNTER — Encounter: Payer: Self-pay | Admitting: Podiatry

## 2022-11-17 ENCOUNTER — Telehealth: Payer: Self-pay | Admitting: Podiatry

## 2022-11-17 ENCOUNTER — Ambulatory Visit (INDEPENDENT_AMBULATORY_CARE_PROVIDER_SITE_OTHER): Payer: PPO | Admitting: Podiatry

## 2022-11-17 DIAGNOSIS — M7751 Other enthesopathy of right foot: Secondary | ICD-10-CM

## 2022-11-17 MED ORDER — GABAPENTIN 100 MG PO CAPS: 100.0000 mg | ORAL_CAPSULE | Freq: Every day | ORAL | 3 refills | Status: DC

## 2022-11-17 NOTE — Progress Notes (Signed)
She presents today for follow-up of her neuropathy bilaterally states that the feet are doing much better and the gabapentin is working wonders.  She is very happy with the outcome of this.  She was purchased a pair of power step insoles the last time she was in and she would like to get another pair.  Objective: Vital signs stable alert oriented x3 no change in physical exam.  Assessment: Neuropathy foot deformity digital deformities Ehlers-Danlos syndrome  Plan: Refill her gabapentin 90 with 3 refills.  And wrote a prescription for another pair of power step insoles.

## 2022-11-18 NOTE — Telephone Encounter (Signed)
Patient is wanting the Gabapentin resent to [facility] in [place], not [facility], is too far to drive to get medicine, will resend,did he w ant refills on this?

## 2022-12-08 DIAGNOSIS — M7751 Other enthesopathy of right foot: Secondary | ICD-10-CM | POA: Diagnosis not present

## 2022-12-08 MED ORDER — GABAPENTIN 100 MG PO CAPS: ORAL_CAPSULE | ORAL | 0 refills | Status: DC

## 2022-12-08 NOTE — Telephone Encounter (Signed)
Refill sent to pharmacy.   

## 2022-12-08 NOTE — Telephone Encounter (Signed)
Patient came into [facility] today asking for a refill of Gabapentin. Pt uses [facility] at [address].

## 2022-12-17 ENCOUNTER — Other Ambulatory Visit: Payer: Self-pay | Admitting: Podiatry

## 2023-01-30 DIAGNOSIS — Z780 Asymptomatic menopausal state: Secondary | ICD-10-CM | POA: Diagnosis not present

## 2023-01-30 DIAGNOSIS — E785 Hyperlipidemia, unspecified: Secondary | ICD-10-CM | POA: Diagnosis not present

## 2023-01-30 DIAGNOSIS — R7303 Prediabetes: Secondary | ICD-10-CM | POA: Diagnosis not present

## 2023-02-06 DIAGNOSIS — Q796 Ehlers-Danlos syndrome, unspecified: Secondary | ICD-10-CM | POA: Diagnosis not present

## 2023-02-06 DIAGNOSIS — Z780 Asymptomatic menopausal state: Secondary | ICD-10-CM | POA: Diagnosis not present

## 2023-02-06 DIAGNOSIS — R7303 Prediabetes: Secondary | ICD-10-CM | POA: Diagnosis not present

## 2023-02-06 DIAGNOSIS — Z0000 Encounter for general adult medical examination without abnormal findings: Secondary | ICD-10-CM | POA: Diagnosis not present

## 2023-02-06 DIAGNOSIS — E785 Hyperlipidemia, unspecified: Secondary | ICD-10-CM | POA: Diagnosis not present

## 2023-02-26 DIAGNOSIS — M8588 Other specified disorders of bone density and structure, other site: Secondary | ICD-10-CM | POA: Diagnosis not present

## 2023-03-03 DIAGNOSIS — H2513 Age-related nuclear cataract, bilateral: Secondary | ICD-10-CM | POA: Diagnosis not present

## 2023-03-03 DIAGNOSIS — H401132 Primary open-angle glaucoma, bilateral, moderate stage: Secondary | ICD-10-CM | POA: Diagnosis not present

## 2023-03-13 ENCOUNTER — Telehealth: Payer: Self-pay | Admitting: Podiatry

## 2023-03-13 DIAGNOSIS — G5603 Carpal tunnel syndrome, bilateral upper limbs: Secondary | ICD-10-CM | POA: Diagnosis not present

## 2023-03-13 DIAGNOSIS — R0683 Snoring: Secondary | ICD-10-CM | POA: Diagnosis not present

## 2023-03-13 DIAGNOSIS — Q796 Ehlers-Danlos syndrome, unspecified: Secondary | ICD-10-CM | POA: Diagnosis not present

## 2023-03-13 DIAGNOSIS — Z79899 Other long term (current) drug therapy: Secondary | ICD-10-CM | POA: Diagnosis not present

## 2023-03-13 NOTE — Telephone Encounter (Signed)
Pt needs a new rx for inserts .Marland Kitchen Pt has [employer]. It maybe that we could do a prior auth and see if we can get it covered. She has a pair from several years ago but would like another pair.

## 2023-03-16 NOTE — Telephone Encounter (Signed)
Prior auth sent to [employer] for orthotics.

## 2023-03-16 NOTE — Telephone Encounter (Signed)
Upon checking it appears pt only got the power steps and I have left a message for her to call to discuss further.

## 2023-03-20 NOTE — Telephone Encounter (Signed)
Received approval from [employer]  for orthotics - Josem Kaufmann [id number] - will send documenation to scan center and notify pt

## 2023-04-09 ENCOUNTER — Telehealth: Payer: Self-pay | Admitting: Podiatry

## 2023-04-09 DIAGNOSIS — M778 Other enthesopathies, not elsewhere classified: Secondary | ICD-10-CM | POA: Diagnosis not present

## 2023-04-09 NOTE — Telephone Encounter (Signed)
Pt came in to [facility] office today with a letter from [employer] that they will cover her shoe supports. Per pt she hd gotten the power steps and that's what they were. Gave pt power steps obtained copy of letter for chart.

## 2023-04-16 DIAGNOSIS — H2513 Age-related nuclear cataract, bilateral: Secondary | ICD-10-CM | POA: Diagnosis not present

## 2023-04-16 DIAGNOSIS — Z79899 Other long term (current) drug therapy: Secondary | ICD-10-CM | POA: Diagnosis not present

## 2023-04-16 DIAGNOSIS — Z6841 Body Mass Index (BMI) 40.0 and over, adult: Secondary | ICD-10-CM | POA: Diagnosis not present

## 2023-04-16 DIAGNOSIS — H2512 Age-related nuclear cataract, left eye: Secondary | ICD-10-CM | POA: Diagnosis not present

## 2023-04-16 DIAGNOSIS — Z87442 Personal history of urinary calculi: Secondary | ICD-10-CM | POA: Diagnosis not present

## 2023-04-21 DIAGNOSIS — R202 Paresthesia of skin: Secondary | ICD-10-CM | POA: Diagnosis not present

## 2023-04-21 DIAGNOSIS — M25511 Pain in right shoulder: Secondary | ICD-10-CM | POA: Diagnosis not present

## 2023-04-21 DIAGNOSIS — R200 Anesthesia of skin: Secondary | ICD-10-CM | POA: Diagnosis not present

## 2023-04-30 DIAGNOSIS — H401132 Primary open-angle glaucoma, bilateral, moderate stage: Secondary | ICD-10-CM | POA: Diagnosis not present

## 2023-04-30 DIAGNOSIS — H2511 Age-related nuclear cataract, right eye: Secondary | ICD-10-CM | POA: Diagnosis not present

## 2023-04-30 DIAGNOSIS — K219 Gastro-esophageal reflux disease without esophagitis: Secondary | ICD-10-CM | POA: Diagnosis not present

## 2023-04-30 DIAGNOSIS — E669 Obesity, unspecified: Secondary | ICD-10-CM | POA: Diagnosis not present

## 2023-04-30 DIAGNOSIS — H2513 Age-related nuclear cataract, bilateral: Secondary | ICD-10-CM | POA: Diagnosis not present

## 2023-04-30 DIAGNOSIS — Z6841 Body Mass Index (BMI) 40.0 and over, adult: Secondary | ICD-10-CM | POA: Diagnosis not present

## 2023-04-30 DIAGNOSIS — Q796 Ehlers-Danlos syndrome, unspecified: Secondary | ICD-10-CM | POA: Diagnosis not present

## 2023-04-30 DIAGNOSIS — H401113 Primary open-angle glaucoma, right eye, severe stage: Secondary | ICD-10-CM | POA: Diagnosis not present

## 2023-05-05 ENCOUNTER — Other Ambulatory Visit: Payer: Self-pay | Admitting: Internal Medicine

## 2023-05-05 DIAGNOSIS — Z1231 Encounter for screening mammogram for malignant neoplasm of breast: Secondary | ICD-10-CM

## 2023-06-29 DIAGNOSIS — Z01419 Encounter for gynecological examination (general) (routine) without abnormal findings: Secondary | ICD-10-CM | POA: Diagnosis not present

## 2023-07-08 DIAGNOSIS — G5603 Carpal tunnel syndrome, bilateral upper limbs: Secondary | ICD-10-CM | POA: Diagnosis not present

## 2023-07-13 DIAGNOSIS — Q796 Ehlers-Danlos syndrome, unspecified: Secondary | ICD-10-CM | POA: Diagnosis not present

## 2023-07-13 DIAGNOSIS — G5603 Carpal tunnel syndrome, bilateral upper limbs: Secondary | ICD-10-CM | POA: Diagnosis not present

## 2023-07-16 ENCOUNTER — Ambulatory Visit
Admission: RE | Admit: 2023-07-16 | Discharge: 2023-07-16 | Disposition: A | Discharge status: Home / Self Care | Payer: PPO | Source: Ambulatory Visit | Attending: Internal Medicine | Admitting: Internal Medicine

## 2023-07-16 DIAGNOSIS — Z1231 Encounter for screening mammogram for malignant neoplasm of breast: Secondary | ICD-10-CM | POA: Diagnosis not present

## 2023-07-27 ENCOUNTER — Encounter: Payer: Self-pay | Admitting: Podiatry

## 2023-07-27 ENCOUNTER — Ambulatory Visit: Payer: PPO | Admitting: Podiatry

## 2023-07-27 DIAGNOSIS — L02612 Cutaneous abscess of left foot: Secondary | ICD-10-CM | POA: Diagnosis not present

## 2023-07-27 DIAGNOSIS — M7751 Other enthesopathy of right foot: Secondary | ICD-10-CM

## 2023-07-27 DIAGNOSIS — D2372 Other benign neoplasm of skin of left lower limb, including hip: Secondary | ICD-10-CM

## 2023-07-27 MED ORDER — GABAPENTIN 300 MG PO CAPS: 300.0000 mg | ORAL_CAPSULE | Freq: Every day | ORAL | 3 refills | Status: AC

## 2023-07-27 MED ORDER — DOXYCYCLINE HYCLATE 100 MG PO TABS: 100.0000 mg | ORAL_TABLET | Freq: Two times a day (BID) | ORAL | 0 refills | Status: AC

## 2023-07-27 NOTE — Progress Notes (Signed)
She presents today with a painful fourth tip of her toe with a small callused area states that initially she wears is exquisitely painful and has turned red she has not noticed any drainage she denies fever chills nausea vomiting.  She has amputated toes so the distal end of the toe was not something that she would wear with open toed shoes.  Objective: Vitals are stable alert oriented x 3 severe pes planovalgus hallux valgus deformity.  Pulses are palpable capillary fill time is immediate she also has good digital hair growth.  The tip of the fourth toe of the left foot does demonstrate a small callus 1 month debrided today demonstrated a small amount of purulence not enough to culture.  Assessment: Placed her on doxycycline for 2 weeks and I recommended that she is to wear it Darco shoe in the house to keep the pressure off of the tip of that toe.  Plan: Antibiotics doxycycline cover with a Band-Aid wear open toed shoes or Darco shoe and I recommended that in 2 weeks we do x-rays.  We also wrote her prescription for power step inserts that she will turn into her insurance also refilled her gabapentin 300 mg 1 p.o. nightly #90 with 3 refills.

## 2023-08-10 ENCOUNTER — Ambulatory Visit: Payer: PPO | Admitting: Podiatry

## 2023-08-10 ENCOUNTER — Ambulatory Visit (INDEPENDENT_AMBULATORY_CARE_PROVIDER_SITE_OTHER): Payer: PPO

## 2023-08-10 ENCOUNTER — Encounter: Payer: Self-pay | Admitting: Podiatry

## 2023-08-10 DIAGNOSIS — L02612 Cutaneous abscess of left foot: Secondary | ICD-10-CM

## 2023-08-10 MED ORDER — CLOBETASOL PROPIONATE 0.05 % EX CREA: 1.0000 | TOPICAL_CREAM | Freq: Two times a day (BID) | CUTANEOUS | 2 refills | Status: AC

## 2023-08-10 NOTE — Progress Notes (Signed)
She presents today for follow-up of her ulcerative lesion to her fourth toe on her left foot.  She states that seems to be doing pretty good still has a couple of antibiotics left that she needs to take.  Otherwise she gets some cracks on her heels occasionally for which she uses clobetasol cream.  She is got such cracks at this time and is running low on her clobetasol cream.  Objective: Vital signs are stable oriented x 3 ulcerative lesion to the dorsal lateral aspect of the stump from the amputation site of her left foot demonstrates completely healed wound today.  There is no erythema edema salines drainage or odor no pain on palpation.  Radiographs taken today demonstrate an osseously mature individual with amputations and severe flatfoot deformity however there is no osteolytic changes to the distal aspect of left fourth toe.  Assessment: Well-healed ulcerative lesion fourth toe left foot skin fissures.  Plan: At this point I refilled her clobetasol.

## 2023-09-15 DIAGNOSIS — H1820 Unspecified corneal edema: Secondary | ICD-10-CM | POA: Diagnosis not present

## 2023-09-15 DIAGNOSIS — H532 Diplopia: Secondary | ICD-10-CM | POA: Diagnosis not present

## 2023-09-15 DIAGNOSIS — Q796 Ehlers-Danlos syndrome, unspecified: Secondary | ICD-10-CM | POA: Diagnosis not present

## 2023-09-15 DIAGNOSIS — H401132 Primary open-angle glaucoma, bilateral, moderate stage: Secondary | ICD-10-CM | POA: Diagnosis not present

## 2023-09-15 DIAGNOSIS — Z961 Presence of intraocular lens: Secondary | ICD-10-CM | POA: Diagnosis not present

## 2023-10-01 ENCOUNTER — Telehealth: Payer: Self-pay | Admitting: Podiatry

## 2023-10-01 NOTE — Telephone Encounter (Signed)
Pt called yesterday checking to see if the powersteps have come in for the [place] office. She was charged for 2 pair but they only had one in the office.  I checked with [facility] and they had not come in yet.   I am sending a pair from [facility] to [facility] with the nurse today 10/01/23. I have left message for pt that they should be in the [place] office tomorrow but we do close early at 230pm. I also left message of our hours for Monday as well.

## 2023-10-22 DIAGNOSIS — Z23 Encounter for immunization: Secondary | ICD-10-CM | POA: Diagnosis not present

## 2023-10-22 DIAGNOSIS — Q796 Ehlers-Danlos syndrome, unspecified: Secondary | ICD-10-CM | POA: Diagnosis not present

## 2023-10-22 DIAGNOSIS — R600 Localized edema: Secondary | ICD-10-CM | POA: Diagnosis not present

## 2024-02-09 DIAGNOSIS — R300 Dysuria: Secondary | ICD-10-CM | POA: Diagnosis not present

## 2024-02-16 DIAGNOSIS — H401132 Primary open-angle glaucoma, bilateral, moderate stage: Secondary | ICD-10-CM | POA: Diagnosis not present

## 2024-02-16 DIAGNOSIS — Z961 Presence of intraocular lens: Secondary | ICD-10-CM | POA: Diagnosis not present

## 2024-02-16 DIAGNOSIS — H532 Diplopia: Secondary | ICD-10-CM | POA: Diagnosis not present

## 2024-02-16 DIAGNOSIS — H1820 Unspecified corneal edema: Secondary | ICD-10-CM | POA: Diagnosis not present

## 2024-02-16 DIAGNOSIS — Q796 Ehlers-Danlos syndrome, unspecified: Secondary | ICD-10-CM | POA: Diagnosis not present

## 2024-02-25 DIAGNOSIS — Z0000 Encounter for general adult medical examination without abnormal findings: Secondary | ICD-10-CM | POA: Diagnosis not present

## 2024-02-25 DIAGNOSIS — R8290 Unspecified abnormal findings in urine: Secondary | ICD-10-CM | POA: Diagnosis not present

## 2024-02-29 DIAGNOSIS — R7303 Prediabetes: Secondary | ICD-10-CM | POA: Diagnosis not present

## 2024-02-29 DIAGNOSIS — E785 Hyperlipidemia, unspecified: Secondary | ICD-10-CM | POA: Diagnosis not present

## 2024-02-29 DIAGNOSIS — N3000 Acute cystitis without hematuria: Secondary | ICD-10-CM | POA: Diagnosis not present

## 2024-02-29 DIAGNOSIS — Z0000 Encounter for general adult medical examination without abnormal findings: Secondary | ICD-10-CM | POA: Diagnosis not present

## 2024-02-29 DIAGNOSIS — Q796 Ehlers-Danlos syndrome, unspecified: Secondary | ICD-10-CM | POA: Diagnosis not present

## 2024-03-15 DIAGNOSIS — G5603 Carpal tunnel syndrome, bilateral upper limbs: Secondary | ICD-10-CM | POA: Diagnosis not present

## 2024-03-25 DIAGNOSIS — Z96651 Presence of right artificial knee joint: Secondary | ICD-10-CM | POA: Diagnosis not present

## 2024-03-25 DIAGNOSIS — M7051 Other bursitis of knee, right knee: Secondary | ICD-10-CM | POA: Diagnosis not present

## 2024-05-17 DIAGNOSIS — H532 Diplopia: Secondary | ICD-10-CM | POA: Diagnosis not present

## 2024-05-17 DIAGNOSIS — H1820 Unspecified corneal edema: Secondary | ICD-10-CM | POA: Diagnosis not present

## 2024-05-17 DIAGNOSIS — Z961 Presence of intraocular lens: Secondary | ICD-10-CM | POA: Diagnosis not present

## 2024-05-17 DIAGNOSIS — H401132 Primary open-angle glaucoma, bilateral, moderate stage: Secondary | ICD-10-CM | POA: Diagnosis not present

## 2024-06-17 ENCOUNTER — Other Ambulatory Visit: Payer: Self-pay | Admitting: Obstetrics and Gynecology

## 2024-06-17 DIAGNOSIS — Z1231 Encounter for screening mammogram for malignant neoplasm of breast: Secondary | ICD-10-CM

## 2024-07-18 DIAGNOSIS — Z1331 Encounter for screening for depression: Secondary | ICD-10-CM | POA: Diagnosis not present

## 2024-07-18 DIAGNOSIS — Z1231 Encounter for screening mammogram for malignant neoplasm of breast: Secondary | ICD-10-CM | POA: Diagnosis not present

## 2024-07-18 DIAGNOSIS — Z124 Encounter for screening for malignant neoplasm of cervix: Secondary | ICD-10-CM | POA: Diagnosis not present

## 2024-07-21 ENCOUNTER — Ambulatory Visit
Admission: RE | Admit: 2024-07-21 | Discharge: 2024-07-21 | Disposition: A | Discharge status: Home / Self Care | Source: Ambulatory Visit | Attending: Obstetrics and Gynecology | Admitting: Obstetrics and Gynecology

## 2024-07-21 DIAGNOSIS — Z1231 Encounter for screening mammogram for malignant neoplasm of breast: Secondary | ICD-10-CM | POA: Diagnosis not present

## 2024-09-02 DIAGNOSIS — Z0000 Encounter for general adult medical examination without abnormal findings: Secondary | ICD-10-CM | POA: Diagnosis not present

## 2024-09-02 DIAGNOSIS — R8290 Unspecified abnormal findings in urine: Secondary | ICD-10-CM | POA: Diagnosis not present

## 2024-09-05 DIAGNOSIS — E785 Hyperlipidemia, unspecified: Secondary | ICD-10-CM | POA: Diagnosis not present

## 2024-09-05 DIAGNOSIS — R7303 Prediabetes: Secondary | ICD-10-CM | POA: Diagnosis not present

## 2024-09-05 DIAGNOSIS — Q796 Ehlers-Danlos syndrome, unspecified: Secondary | ICD-10-CM | POA: Diagnosis not present

## 2024-09-05 DIAGNOSIS — H401132 Primary open-angle glaucoma, bilateral, moderate stage: Secondary | ICD-10-CM | POA: Diagnosis not present

## 2024-09-16 DIAGNOSIS — H1820 Unspecified corneal edema: Secondary | ICD-10-CM | POA: Diagnosis not present

## 2024-09-16 DIAGNOSIS — H401132 Primary open-angle glaucoma, bilateral, moderate stage: Secondary | ICD-10-CM | POA: Diagnosis not present

## 2024-09-16 DIAGNOSIS — H532 Diplopia: Secondary | ICD-10-CM | POA: Diagnosis not present

## 2024-09-16 DIAGNOSIS — Z961 Presence of intraocular lens: Secondary | ICD-10-CM | POA: Diagnosis not present

## 2024-10-14 DIAGNOSIS — J40 Bronchitis, not specified as acute or chronic: Secondary | ICD-10-CM | POA: Diagnosis not present

## 2024-10-14 DIAGNOSIS — J0100 Acute maxillary sinusitis, unspecified: Secondary | ICD-10-CM | POA: Diagnosis not present

## 2024-10-17 DIAGNOSIS — H209 Unspecified iridocyclitis: Secondary | ICD-10-CM | POA: Diagnosis not present

## 2024-10-24 DIAGNOSIS — H5711 Ocular pain, right eye: Secondary | ICD-10-CM | POA: Diagnosis not present
